# Patient Record
Sex: Female | Born: 1950 | Race: Black or African American | Hispanic: No | Marital: Married | Smoking: Never smoker
Health system: Southern US, Community
[De-identification: ages and names within clinical notes are randomized; demographics above are authoritative.]

## PROBLEM LIST (undated history)

## (undated) DIAGNOSIS — K219 Gastro-esophageal reflux disease without esophagitis: Secondary | ICD-10-CM

## (undated) DIAGNOSIS — T7840XA Allergy, unspecified, initial encounter: Secondary | ICD-10-CM

## (undated) DIAGNOSIS — I10 Essential (primary) hypertension: Secondary | ICD-10-CM

## (undated) DIAGNOSIS — R42 Dizziness and giddiness: Secondary | ICD-10-CM

## (undated) HISTORY — DX: Essential (primary) hypertension: I10

## (undated) HISTORY — DX: Gastro-esophageal reflux disease without esophagitis: K21.9

## (undated) HISTORY — PX: ABDOMINAL HYSTERECTOMY: SHX81

## (undated) HISTORY — DX: Allergy, unspecified, initial encounter: T78.40XA

## (undated) HISTORY — DX: Dizziness and giddiness: R42

---

## 1998-10-31 ENCOUNTER — Ambulatory Visit (HOSPITAL_COMMUNITY): Admission: RE | Admit: 1998-10-31 | Discharge: 1998-10-31 | Payer: Self-pay | Admitting: Neurology

## 2006-05-15 ENCOUNTER — Emergency Department: Payer: Self-pay

## 2008-05-24 ENCOUNTER — Emergency Department: Payer: Self-pay | Admitting: Emergency Medicine

## 2009-12-24 ENCOUNTER — Ambulatory Visit: Payer: Self-pay

## 2010-04-02 ENCOUNTER — Other Ambulatory Visit: Payer: Self-pay

## 2010-04-03 ENCOUNTER — Ambulatory Visit: Payer: Self-pay | Admitting: Unknown Physician Specialty

## 2010-11-14 ENCOUNTER — Other Ambulatory Visit: Payer: Self-pay

## 2011-05-19 ENCOUNTER — Ambulatory Visit: Payer: Self-pay

## 2013-02-07 ENCOUNTER — Emergency Department: Payer: Self-pay | Admitting: Unknown Physician Specialty

## 2013-02-07 LAB — COMPREHENSIVE METABOLIC PANEL
Albumin: 3.6 g/dL (ref 3.4–5.0)
Alkaline Phosphatase: 75 U/L (ref 50–136)
Bilirubin,Total: 1.2 mg/dL — ABNORMAL HIGH (ref 0.2–1.0)
Chloride: 107 mmol/L (ref 98–107)
Co2: 29 mmol/L (ref 21–32)
EGFR (African American): >60
Osmolality: 281 (ref 275–301)
Potassium: 3.8 mmol/L (ref 3.5–5.1)
SGOT(AST): 20 U/L (ref 15–37)
Total Protein: 7.3 g/dL (ref 6.4–8.2)

## 2013-02-07 LAB — CBC
HCT: 38.5 % (ref 35.0–47.0)
MCHC: 33 g/dL (ref 32.0–36.0)
MCV: 87 fL (ref 80–100)
RBC: 4.41 10*6/uL (ref 3.80–5.20)
RDW: 13.8 % (ref 11.5–14.5)

## 2013-02-07 LAB — LIPASE, BLOOD: Lipase: 90 U/L (ref 73–393)

## 2013-02-07 LAB — TROPONIN I: Troponin-I: <0.02 ng/mL

## 2013-05-14 ENCOUNTER — Emergency Department (HOSPITAL_COMMUNITY)
Admission: EM | Admit: 2013-05-14 | Discharge: 2013-05-14 | Disposition: A | Discharge status: Home / Self Care | Payer: BC Managed Care – PPO | Attending: Emergency Medicine | Admitting: Emergency Medicine

## 2013-05-14 ENCOUNTER — Encounter (HOSPITAL_COMMUNITY): Payer: Self-pay | Admitting: Emergency Medicine

## 2013-05-14 DIAGNOSIS — K8020 Calculus of gallbladder without cholecystitis without obstruction: Secondary | ICD-10-CM

## 2013-05-14 DIAGNOSIS — R42 Dizziness and giddiness: Secondary | ICD-10-CM | POA: Insufficient documentation

## 2013-05-14 DIAGNOSIS — Z880 Allergy status to penicillin: Secondary | ICD-10-CM | POA: Insufficient documentation

## 2013-05-14 DIAGNOSIS — R110 Nausea: Secondary | ICD-10-CM | POA: Insufficient documentation

## 2013-05-14 DIAGNOSIS — Z79899 Other long term (current) drug therapy: Secondary | ICD-10-CM | POA: Insufficient documentation

## 2013-05-14 DIAGNOSIS — K8050 Calculus of bile duct without cholangitis or cholecystitis without obstruction: Secondary | ICD-10-CM

## 2013-05-14 LAB — COMPREHENSIVE METABOLIC PANEL
ALT: 11 U/L (ref 0–35)
BUN: 9 mg/dL (ref 6–23)
CO2: 27 mEq/L (ref 19–32)
Calcium: 8.9 mg/dL (ref 8.4–10.5)
Chloride: 104 mEq/L (ref 96–112)
Creatinine, Ser: 0.58 mg/dL (ref 0.50–1.10)
GFR calc Af Amer: >90 mL/min (ref >90)
GFR calc non Af Amer: >90 mL/min (ref >90)
Glucose, Bld: 115 mg/dL — ABNORMAL HIGH (ref 70–99)
Total Bilirubin: 0.9 mg/dL (ref 0.3–1.2)
Total Protein: 7.4 g/dL (ref 6.0–8.3)

## 2013-05-14 LAB — CBC WITH DIFFERENTIAL/PLATELET
Basophils Relative: 0 % (ref 0–1)
Eosinophils Relative: 0 % (ref 0–5)
HCT: 40 % (ref 36.0–46.0)
Hemoglobin: 13.8 g/dL (ref 12.0–15.0)
Lymphocytes Relative: 16 % (ref 12–46)
MCH: 30.1 pg (ref 26.0–34.0)
MCHC: 34.5 g/dL (ref 30.0–36.0)
MCV: 87.1 fL (ref 78.0–100.0)
Monocytes Absolute: 0.2 10*3/uL (ref 0.1–1.0)
Monocytes Relative: 3 % (ref 3–12)
Neutro Abs: 5.9 10*3/uL (ref 1.7–7.7)
RDW: 13.7 % (ref 11.5–15.5)
WBC: 7.3 10*3/uL (ref 4.0–10.5)

## 2013-05-14 LAB — GLUCOSE, CAPILLARY: Glucose-Capillary: 93 mg/dL (ref 70–99)

## 2013-05-14 LAB — POCT I-STAT TROPONIN I: Troponin i, poc: 0 ng/mL (ref 0.00–0.08)

## 2013-05-14 MED ORDER — MECLIZINE HCL 25 MG PO TABS
12.5000 mg | ORAL_TABLET | Freq: Once | ORAL | Status: AC
  Administered 2013-05-14: 12.5 mg via ORAL
  Filled 2013-05-14: qty 1

## 2013-05-14 MED ORDER — MECLIZINE HCL 12.5 MG PO TABS: 12.5000 mg | ORAL_TABLET | Freq: Three times a day (TID) | ORAL | Status: DC | PRN

## 2013-05-14 NOTE — ED Notes (Signed)
Pt reports having dizziness since early oct. [facility] recently for [facility]iness and upper abd pain, was told she has gallstones and inner ear problems. No relief with pills given. The dizziness seems to be positional, increases when she lays down on certain side and when standing up. No acute distress noted at triage.

## 2013-05-14 NOTE — ED Notes (Signed)
Dr. Gentry at bedside. 

## 2013-05-16 NOTE — ED Provider Notes (Signed)
CSN: [id number]     Arrival date & time 05/14/13  1724 History   First MD Initiated Contact with Patient 05/14/13 1743     Chief Complaint  Patient presents with  . Dizziness   (Consider location/radiation/quality/duration/timing/severity/associated sxs/prior Treatment) Patient is a 62 y.o. female presenting with neurologic complaint.  Neurologic Problem This is a new problem. Episode onset: 2 weeks ago. The problem occurs constantly. The problem has been unchanged. Associated symptoms include nausea and vertigo. Pertinent negatives include no abdominal pain, chest pain, chills, congestion, coughing, fever, numbness, rash, sore throat or vomiting. Nothing aggravates the symptoms. She has tried nothing for the symptoms.    History reviewed. No pertinent past medical history. History reviewed. No pertinent past surgical history. History reviewed. No pertinent family history. History  Substance Use Topics  . Smoking status: Not on file  . Smokeless tobacco: Not on file  . Alcohol Use: No   OB History   Grav Para Term Preterm Abortions TAB SAB Ect Mult Living                 Review of Systems  Constitutional: Negative for fever and chills.  HENT: Negative for congestion, rhinorrhea and sore throat.   Eyes: Negative for photophobia and visual disturbance.  Respiratory: Negative for cough and shortness of breath.   Cardiovascular: Negative for chest pain and leg swelling.  Gastrointestinal: Positive for nausea. Negative for vomiting, abdominal pain, diarrhea and constipation.  Endocrine: Negative for polyphagia and polyuria.  Genitourinary: Negative for dysuria, flank pain, vaginal bleeding, vaginal discharge and enuresis.  Musculoskeletal: Negative for back pain and gait problem.  Skin: Negative for color change and rash.  Neurological: Positive for vertigo. Negative for dizziness, syncope, light-headedness and numbness.  Hematological: Negative for adenopathy. Does not  bruise/bleed easily.  All other systems reviewed and are negative.    Allergies  Penicillins  Home Medications   Current Outpatient Rx  Name  Route  Sig  Dispense  Refill  . meclizine (ANTIVERT) 12.5 MG tablet   Oral   Take 1 tablet (12.5 mg total) by mouth 3 (three) times daily as needed.   30 tablet   0    BP 135/83  Pulse 80  Temp(Src) 98.1 F (36.7 C) (Oral)  Resp 16  SpO2 97% Physical Exam  Vitals reviewed. Constitutional: She is oriented to person, place, and time. She appears well-developed and well-nourished.  HENT:  Head: Normocephalic and atraumatic.  Right Ear: External ear normal.  Left Ear: External ear normal.  Eyes: Conjunctivae and EOM are normal. Pupils are equal, round, and reactive to light.  Neck: Normal range of motion. Neck supple.  Cardiovascular: Normal rate, regular rhythm, normal heart sounds and intact distal pulses.   Pulmonary/Chest: Effort normal and breath sounds normal.  Abdominal: Soft. Bowel sounds are normal. There is no tenderness.  Musculoskeletal: Normal range of motion.  Neurological: She is alert and oriented to person, place, and time.  + dix halpike.  Symptoms relieved by epley  Skin: Skin is warm and dry.    ED Course  Procedures (including critical care time) Labs Review Labs Reviewed  CBC WITH DIFFERENTIAL - Abnormal; Notable for the following:    Neutrophils Relative % 81 (*)    All other components within normal limits  COMPREHENSIVE METABOLIC PANEL - Abnormal; Notable for the following:    Glucose, Bld 115 (*)    All other components within normal limits  GLUCOSE, CAPILLARY  POCT I-STAT TROPONIN I  Results for orders placed during the hospital encounter of 05/14/13  CBC WITH DIFFERENTIAL      Result Value Range   WBC 7.3  4.0 - 10.5 K/uL   RBC 4.59  3.87 - 5.11 MIL/uL   Hemoglobin 13.8  12.0 - 15.0 g/dL   HCT 82.9  56.2 - 13.0 %   MCV 87.1  78.0 - 100.0 fL   MCH 30.1  26.0 - 34.0 pg   MCHC 34.5  30.0 -  36.0 g/dL   RDW 86.5  78.4 - 69.6 %   Platelets 218  150 - 400 K/uL   Neutrophils Relative % 81 (*) 43 - 77 %   Neutro Abs 5.9  1.7 - 7.7 K/uL   Lymphocytes Relative 16  12 - 46 %   Lymphs Abs 1.2  0.7 - 4.0 K/uL   Monocytes Relative 3  3 - 12 %   Monocytes Absolute 0.2  0.1 - 1.0 K/uL   Eosinophils Relative 0  0 - 5 %   Eosinophils Absolute 0.0  0.0 - 0.7 K/uL   Basophils Relative 0  0 - 1 %   Basophils Absolute 0.0  0.0 - 0.1 K/uL  COMPREHENSIVE METABOLIC PANEL      Result Value Range   Sodium 141  135 - 145 mEq/L   Potassium 3.5  3.5 - 5.1 mEq/L   Chloride 104  96 - 112 mEq/L   CO2 27  19 - 32 mEq/L   Glucose, Bld 115 (*) 70 - 99 mg/dL   BUN 9  6 - 23 mg/dL   Creatinine, Ser 2.95  0.50 - 1.10 mg/dL   Calcium 8.9  8.4 - 28.4 mg/dL   Total Protein 7.4  6.0 - 8.3 g/dL   Albumin 3.8  3.5 - 5.2 g/dL   AST 16  0 - 37 U/L   ALT 11  0 - 35 U/L   Alkaline Phosphatase 71  39 - 117 U/L   Total Bilirubin 0.9  0.3 - 1.2 mg/dL   GFR calc non Af Amer >90  >90 mL/min   GFR calc Af Amer >90  >90 mL/min  GLUCOSE, CAPILLARY      Result Value Range   Glucose-Capillary 93  70 - 99 mg/dL   Comment 1 Documented in Chart    POCT I-STAT TROPONIN I      Result Value Range   Troponin i, poc 0.00  0.00 - 0.08 ng/mL   Comment 3             Imaging Review No results found.  EKG Interpretation   None       MDM   1. Vertigo   2. Cholelithiases   3. Biliary colic    62 y.o. female  without pertinent PMH presents with vertigo. Patient was seen at [facility] 2 weeks ago for same symptoms discharged home without medication. Patient also complained of abdominal pain in the right upper quadrant, and an ultrasound performed with cholelithiasis without cholecystitis. She is scheduled to follow up with gastroenterology. She denies recent increase in the symptoms and has not been evaluated for same per her own report. On arrival today patient has symptoms consistent with vertigo. Physical exam with  positive Dix-Hallpike towards the left. No carotid bruit.   Epley maneuver performed with relief of symptoms.  Discussed likely etiology of benign positional vertigo with patient, who voices understanding. Also discussed possibility of occult carotid or vertebral pathology, however and clinical scenario as above  doubt these are the etiology of patient's symptoms.  Patient discharged with meclizine and will follow up with PCP.    Labs and imaging as above reviewed by myself and attending,Dr. Lynelle Doctor, with whom case was discussed.   1. Vertigo   2. Cholelithiases   3. Biliary colic         Noel Gerold, MD 05/16/13 [phone]

## 2013-05-18 NOTE — ED Provider Notes (Signed)
I saw and evaluated the patient, reviewed the resident's note and I agree with the findings and plan. Pt presented  with complaints of vertigo and abdominal pain.  Evaluated 2 weeks ago at another hospital for the same complaint.    Has known gallstones with GI follow up  PE suggestive of peripheral vertigo.    Acute cholecystitis unlikely based on symptoms, labs and exam.  At this time there does not appear to be any evidence of an acute emergency medical condition and the patient appears stable for discharge with appropriate outpatient follow up.   EKG Rate 76 Sinus rhythm normal st t waves Nl intervals and axis No old tracing to compare  Celene Kras, MD 05/18/13 [phone]

## 2014-10-13 ENCOUNTER — Emergency Department: Payer: Self-pay | Admitting: Emergency Medicine

## 2015-05-09 ENCOUNTER — Emergency Department
Admission: EM | Admit: 2015-05-09 | Discharge: 2015-05-09 | Disposition: A | Discharge status: Home / Self Care | Payer: PPO | Attending: Emergency Medicine | Admitting: Emergency Medicine

## 2015-05-09 DIAGNOSIS — R42 Dizziness and giddiness: Secondary | ICD-10-CM | POA: Insufficient documentation

## 2015-05-09 DIAGNOSIS — R1110 Vomiting, unspecified: Secondary | ICD-10-CM | POA: Diagnosis not present

## 2015-05-09 DIAGNOSIS — Z880 Allergy status to penicillin: Secondary | ICD-10-CM | POA: Diagnosis not present

## 2015-05-09 MED ORDER — ONDANSETRON HCL 4 MG PO TABS: 4.0000 mg | ORAL_TABLET | Freq: Three times a day (TID) | ORAL | Status: DC | PRN

## 2015-05-09 MED ORDER — MECLIZINE HCL 50 MG PO TABS: 50.0000 mg | ORAL_TABLET | Freq: Three times a day (TID) | ORAL | Status: DC | PRN

## 2015-05-09 MED ORDER — MECLIZINE HCL 25 MG PO TABS
25.0000 mg | ORAL_TABLET | Freq: Once | ORAL | Status: AC
  Administered 2015-05-09: 25 mg via ORAL
  Filled 2015-05-09: qty 1

## 2015-05-09 MED ORDER — ONDANSETRON 4 MG PO TBDP
4.0000 mg | ORAL_TABLET | Freq: Once | ORAL | Status: AC
  Administered 2015-05-09: 4 mg via ORAL
  Filled 2015-05-09: qty 1

## 2015-05-09 NOTE — Discharge Instructions (Signed)
If you have any other symptoms aside from simple vertigo including weakness, difficulty walking, difficulty talking, headache, or lightheadedness please return to the emergency department. If your symptoms persist or worsen in anyway concerning to please return to the emergency room. Follow-up closely as an outpatient for further work on this issue.

## 2015-05-09 NOTE — ED Notes (Signed)
Patient ambulated around nurses station and denies dizziness.

## 2015-05-09 NOTE — ED Provider Notes (Addendum)
[facility] Emergency Department Provider Note  ____________________________________________   I have reviewed the triage vital signs and the nursing notes.   HISTORY  Chief Complaint Dizziness    HPI Erin Rowe is a 64 y.o. female presents today complaining of vertigo. In fact her chief complaint is "my vertigo is back". Patient has had this 5 or 6 times she states in the last 5 or 6 years. She has had positive success with Epley maneuver and tried something similar at home with no results this time. She has had some vomiting. She has had no headache. She denies any numbness or weakness or any other neurologic problem. She denies difficult speaking or talking. Began the day before yesterday in the evening. No recent URI or inciting symptoms. Feels exactly like multiple prior episodes of vertigo.  History reviewed. No pertinent past medical history.  There are no active problems to display for this patient.   History reviewed. No pertinent past surgical history.  Current Outpatient Rx  Name  Route  Sig  Dispense  Refill  . meclizine (ANTIVERT) 12.5 MG tablet   Oral   Take 1 tablet (12.5 mg total) by mouth 3 (three) times daily as needed.   30 tablet   0     Allergies Penicillins  History reviewed. No pertinent family history.  Social History Social History  Substance Use Topics  . Smoking status: Never Smoker   . Smokeless tobacco: None  . Alcohol Use: No    Review of Systems Constitutional: No fever/chills Eyes: No visual changes. ENT: No sore throat. No stiff neck no neck pain Cardiovascular: Denies chest pain. Respiratory: Denies shortness of breath. Gastrointestinal:   Positive vomiting.  No diarrhea.  No constipation. Genitourinary: Negative for dysuria. Musculoskeletal: Negative lower extremity swelling Skin: Negative for rash. Neurological: Negative for headaches, focal weakness or numbness. 10-point ROS otherwise  negative.  ____________________________________________   PHYSICAL EXAM:  VITAL SIGNS: ED Triage Vitals  Enc Vitals Group     BP 05/09/15 0941 141/82 mmHg     Pulse Rate 05/09/15 0941 92     Resp 05/09/15 0941 18     Temp 05/09/15 0941 98.2 F (36.8 C)     Temp Source 05/09/15 0941 Oral     SpO2 05/09/15 0941 97 %     Weight --      Height --      Head Cir --      Peak Flow --      Pain Score --      Pain Loc --      Pain Edu? --      Excl. in GC? --     Constitutional: Alert and oriented. Well appearing and in no acute distress. Eyes: Conjunctivae are normal. PERRL. EOMI. Head: Atraumatic. Nose: No congestion/rhinnorhea. Mouth/Throat: Mucous membranes are moist.  Oropharynx non-erythematous. TMs are normal bilaterally with no evidence of acute pathology noted Neck: No stridor.   Nontender with no meningismus, I do not auscultate carotid bruits Cardiovascular: Normal rate, regular rhythm. Grossly normal heart sounds.  Good peripheral circulation. Respiratory: Normal respiratory effort.  No retractions. Lungs CTAB. Gastrointestinal: Soft and nontender. No distention. No guarding no rebound Back:  There is no focal tenderness or step off there is no midline tenderness there are no lesions noted. there is no CVA tenderness Musculoskeletal: No lower extremity tenderness. No joint effusions, no DVT signs strong distal pulses no edema Neurologic:  Normal speech and language. No gross focal  neurologic deficits are appreciated. Positive nystagmus noted, there is however no evidence of neurologic deficit otherwise. Nerves II through XII are grossly intact 5 out of 5 strength bilateral upper and lower extremity, heel-to-shin and rapid alternating hand motions are normal finger to nose within normal limits, no evidence of cerebellar dysfunction Skin:  Skin is warm, dry and intact. No rash noted. Psychiatric: Mood and affect are normal. Speech and behavior are  normal.  ____________________________________________   LABS (all labs ordered are listed, but only abnormal results are displayed)  Labs Reviewed - No data to display ____________________________________________  EKG  EKG of pain by triage shows normal sinus rhythm interpreted by me rate 74 bpm normal axis nonspecific ST changes unremarkable ____________________________________________  RADIOLOGY   ____________________________________________   PROCEDURES  Procedure(s) performed: None  Critical Care performed: None  ____________________________________________   INITIAL IMPRESSION / ASSESSMENT AND PLAN / ED COURSE  Pertinent labs & imaging results that were available during my care of the patient were reviewed by me and considered in my medical decision making (see chart for details).  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 She has taken nothing for this at home. We'll try oral medication to see if we can get her out of this, I will also consider the Epley maneuver. This has worked for her in the past. Patient is requesting it. At this time, there is no evidence to suggest that this is a CVA given the recurrent nature of her symptoms. However, we will strongly advised close follow-up with neurology and ENT as an outpatient if we can get the symptoms to resolve here. I do not think emergent imaging is warranted at this time however, we will reassess ____________________________________________  ----------------------------------------- 10:44 AM on 05/09/2015 -----------------------------------------  Patient feels much better at this time after Antivert and Zofran, we did do an Epley repositioning maneuver, patient tolerated it very well with no Medications at this time she states she is almost back to baseline without try to walk around. Neurologic exam remains normal  ----------------------------------------- 11:00 AM on  05/09/2015 -----------------------------------------  Patient relates throughout the room feels great relief has no evidence of vertigo no symptoms or signs and no complaints and we'll discharge her extensive return precautions given and understood. Follow-up understood as well.  FINAL CLINICAL IMPRESSION(S) / ED DIAGNOSES  Vertigo   Jeanmarie PlantJames A Ruby Dilone, MD 05/09/15 1022  Jeanmarie PlantJames A Vian Fluegel, MD 05/09/15 1025  Jeanmarie PlantJames A Kymber Kosar, MD 05/09/15 1044  Jeanmarie PlantJames A Kaziyah Parkison, MD 05/09/15 1101

## 2015-05-09 NOTE — ED Notes (Signed)
Vertigo since Monday night.

## 2015-05-22 ENCOUNTER — Telehealth: Payer: Self-pay | Admitting: Family Medicine

## 2015-05-22 NOTE — Telephone Encounter (Signed)
R/s to 06/29/2015

## 2015-05-22 NOTE — Telephone Encounter (Signed)
30min appointment when possible.  Thanks. 

## 2015-05-22 NOTE — Telephone Encounter (Signed)
Mrs. Erin Rowe called and is wanting to establish as a patient w/you.  Her husband, Erin Rowe is your patient as well.  The first thing you have available is 10/16/2015.  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  Can you accommodate an apptmt for new patient w/[facility] [employer] in December?  Please advise. Thank you.

## 2015-05-31 ENCOUNTER — Ambulatory Visit
Admission: RE | Admit: 2015-05-31 | Discharge: 2015-05-31 | Disposition: A | Discharge status: Home / Self Care | Payer: PPO | Source: Ambulatory Visit | Attending: Unknown Physician Specialty | Admitting: Unknown Physician Specialty

## 2015-05-31 ENCOUNTER — Encounter
Admission: RE | Disposition: A | Discharge status: Home / Self Care | Payer: Self-pay | Source: Ambulatory Visit | Attending: Unknown Physician Specialty

## 2015-05-31 ENCOUNTER — Ambulatory Visit: Payer: PPO | Admitting: Anesthesiology

## 2015-05-31 ENCOUNTER — Encounter: Payer: Self-pay | Admitting: *Deleted

## 2015-05-31 DIAGNOSIS — K621 Rectal polyp: Secondary | ICD-10-CM | POA: Diagnosis not present

## 2015-05-31 DIAGNOSIS — Z1211 Encounter for screening for malignant neoplasm of colon: Secondary | ICD-10-CM | POA: Diagnosis present

## 2015-05-31 DIAGNOSIS — Z800 Family history of malignant neoplasm of digestive organs: Secondary | ICD-10-CM | POA: Insufficient documentation

## 2015-05-31 DIAGNOSIS — K640 First degree hemorrhoids: Secondary | ICD-10-CM | POA: Diagnosis not present

## 2015-05-31 HISTORY — PX: COLONOSCOPY WITH PROPOFOL: SHX5780

## 2015-05-31 LAB — HM COLONOSCOPY
HM COLON: NORMAL
HM Colonoscopy: NORMAL

## 2015-05-31 SURGERY — COLONOSCOPY WITH PROPOFOL
Anesthesia: General

## 2015-05-31 MED ORDER — PROPOFOL 10 MG/ML IV BOLUS
INTRAVENOUS | Status: DC | PRN
Start: 2015-05-31 — End: 2015-05-31
  Administered 2015-05-31: 50 mg via INTRAVENOUS

## 2015-05-31 MED ORDER — MIDAZOLAM HCL 5 MG/5ML IJ SOLN
INTRAMUSCULAR | Status: DC | PRN
  Administered 2015-05-31: 1 mg via INTRAVENOUS

## 2015-05-31 MED ORDER — SODIUM CHLORIDE 0.9 % IV SOLN: INTRAVENOUS | Status: DC

## 2015-05-31 MED ORDER — SODIUM CHLORIDE 0.9 % IV SOLN
INTRAVENOUS | Status: DC
  Administered 2015-05-31: 11:00:00 via INTRAVENOUS

## 2015-05-31 MED ORDER — FENTANYL CITRATE (PF) 100 MCG/2ML IJ SOLN
INTRAMUSCULAR | Status: DC | PRN
  Administered 2015-05-31: 50 ug via INTRAVENOUS

## 2015-05-31 MED ORDER — LIDOCAINE HCL (CARDIAC) 20 MG/ML IV SOLN
INTRAVENOUS | Status: DC | PRN
  Administered 2015-05-31: 50 mg via INTRAVENOUS

## 2015-05-31 MED ORDER — PROPOFOL 500 MG/50ML IV EMUL
INTRAVENOUS | Status: DC | PRN
  Administered 2015-05-31: 120 ug/kg/min via INTRAVENOUS

## 2015-05-31 MED ORDER — EPHEDRINE SULFATE 50 MG/ML IJ SOLN
INTRAMUSCULAR | Status: DC | PRN
  Administered 2015-05-31: 10 mg via INTRAVENOUS

## 2015-05-31 NOTE — Anesthesia Preprocedure Evaluation (Addendum)
Anesthesia Evaluation  Patient identified by MRN, date of birth, ID band Patient awake    Reviewed: Allergy & Precautions, NPO status , Patient's Chart, lab work & pertinent test results  History of Anesthesia Complications (+) PONV  Airway Mallampati: II  TM Distance: >3 FB     Dental no notable dental hx. (+) Upper Dentures   Pulmonary neg pulmonary ROS,    Pulmonary exam normal breath sounds clear to auscultation       Cardiovascular negative cardio ROS Normal cardiovascular exam     Neuro/Psych negative neurological ROS  negative psych ROS   GI/Hepatic negative GI ROS, Neg liver ROS,   Endo/Other  negative endocrine ROS  Renal/GU negative Renal ROS  negative genitourinary   Musculoskeletal negative musculoskeletal ROS (+)   Abdominal Normal abdominal exam  (+)   Peds negative pediatric ROS (+)  Hematology negative hematology ROS (+)   Anesthesia Other Findings   Reproductive/Obstetrics                            Anesthesia Physical Anesthesia Plan  ASA: II  Anesthesia Plan: General   Post-op Pain Management:    Induction: Intravenous  Airway Management Planned: Nasal Cannula  Additional Equipment:   Intra-op Plan:   Post-operative Plan:   Informed Consent: I have reviewed the patients History and Physical, chart, labs and discussed the procedure including the risks, benefits and alternatives for the proposed anesthesia with the patient or authorized representative who has indicated his/her understanding and acceptance.   Dental advisory given  Plan Discussed with: CRNA and Surgeon  Anesthesia Plan Comments:         Anesthesia Quick Evaluation

## 2015-05-31 NOTE — Op Note (Signed)
[facility] Gastroenterology Patient Name: Erin Rowe Procedure Date: 05/31/2015 11:16 AM MRN: [id number] Account #: [id number] Date of Birth: 12/06/1950 Admit Type: Outpatient Age: [age] [facility] Gender: Female Note Status: Finalized Procedure:         Colonoscopy Indications:       Screening in patient at increased risk: Family history of                     1st-degree relative with colorectal cancer Providers:         Scot Junobert T. Izeah Vossler, MD Referring MD:      Dwana CurdGraham S. Para Marchuncan (Referring MD) Medicines:         Propofol per Anesthesia Complications:     No immediate complications. Procedure:         Pre-Anesthesia Assessment:                    - After reviewing the risks and benefits, the patient was                     deemed in satisfactory condition to undergo the procedure.                    After obtaining informed consent, the colonoscope was                     passed under direct vision. Throughout the procedure, the                     patient's blood pressure, pulse, and oxygen saturations                     were monitored continuously. The Colonoscope was                     introduced through the anus and advanced to the the cecum,                     identified by appendiceal orifice and ileocecal valve. The                     colonoscopy was performed without difficulty. The patient                     tolerated the procedure well. The quality of the bowel                     preparation was excellent. Findings:      Three sessile polyps were found in the rectum. The polyps were       diminutive in size. These polyps were removed with a jumbo cold forceps.       Resection and retrieval were complete.      Internal hemorrhoids were found during endoscopy. The hemorrhoids were       small and Grade I (internal hemorrhoids that do not prolapse).      The exam was otherwise without abnormality. Impression:        - Three diminutive  polyps in the rectum. Resected and                     retrieved.                    - Internal hemorrhoids.                    -  The examination was otherwise normal. Recommendation:    - Await pathology results. Scot Jun, MD 05/31/2015 11:42:15 AM This report has been signed electronically. Number of Addenda: 0 Note Initiated On: 05/31/2015 11:16 AM Scope Withdrawal Time: 0 hours 12 minutes 51 seconds  Total Procedure Duration: 0 hours 16 minutes 41 seconds       [facility]

## 2015-05-31 NOTE — H&P (Signed)
   Primary Care Physician:  Crawford GivensGraham Duncan, MD Primary Gastroenterologist:  Dr. Mechele CollinElliott  Pre-Procedure History & Physical: HPI:  Theodosia Blenderancy L Longstreth is a 64 y.o. female is here for an colonoscopy.   History reviewed. No pertinent past medical history.  Past Surgical History  Procedure Laterality Date  . Abdominal hysterectomy      Prior to Admission medications   Medication Sig Start Date End Date Taking? Authorizing Provider  ondansetron (ZOFRAN) 4 MG tablet Take 1 tablet (4 mg total) by mouth every 8 (eight) hours as needed for nausea or vomiting. 05/09/15  Yes Jeanmarie PlantJames A McShane, MD  meclizine (ANTIVERT) 50 MG tablet Take 1 tablet (50 mg total) by mouth 3 (three) times daily as needed. 05/09/15   Jeanmarie PlantJames A McShane, MD    Allergies as of 04/10/2015 - Review Complete 05/14/2013  Allergen Reaction Noted  . Penicillins Rash 05/14/2013    History reviewed. No pertinent family history.  Social History   Social History  . Marital Status: Married    Spouse Name: N/A  . Number of Children: N/A  . Years of Education: N/A   Occupational History  . Not on file.   Social History Main Topics  . Smoking status: Never Smoker   . Smokeless tobacco: Never Used  . Alcohol Use: No  . Drug Use: No  . Sexual Activity: Not on file   Other Topics Concern  . Not on file   Social History Narrative    Review of Systems: See HPI, otherwise negative ROS  Physical Exam: BP 132/80 mmHg  Pulse 88  Temp(Src) 97.5 F (36.4 C) (Tympanic)  Resp 12  Ht 5\' 7"  (1.702 m)  Wt 97.07 kg (214 lb)  BMI 33.51 kg/m2  SpO2 100% General:   Alert,  pleasant and cooperative in NAD Head:  Normocephalic and atraumatic. Neck:  Supple; no masses or thyromegaly. Lungs:  Clear throughout to auscultation.    Heart:  Regular rate and rhythm. Abdomen:  Soft, nontender and nondistended. Normal bowel sounds, without guarding, and without rebound.   Neurologic:  Alert and  oriented x4;  grossly normal  neurologically.  Impression/Plan: Lelon HuhNancy L Rockwell is here for an colonoscopy to be performed for [facility] colon polyps and FH colon cancer in mother  Risks, benefits, limitations, and alternatives regarding  colonoscopy have been reviewed with the patient.  Questions have been answered.  All parties agreeable.   Lynnae PrudeELLIOTT, Vidalia Serpas, MD  05/31/2015, 11:13 AM

## 2015-05-31 NOTE — Transfer of Care (Signed)
Immediate Anesthesia Transfer of Care Note  Patient: Erin Rowe  Procedure(s) Performed: Procedure(s): COLONOSCOPY WITH PROPOFOL (N/A)  Patient Location: PACU  Anesthesia Type:General  Level of Consciousness: awake, alert , oriented and patient cooperative  Airway & Oxygen Therapy: Patient Spontanous Breathing and Patient connected to nasal cannula oxygen  Post-op Assessment: Report given to RN and Post -op Vital signs reviewed and stable  Post vital signs: Reviewed and stable  Last Vitals:  Filed Vitals:   05/31/15 1143  BP: 109/68  Pulse:   Temp: 36.4 C  Resp: 24    Complications: No apparent anesthesia complications

## 2015-06-01 LAB — SURGICAL PATHOLOGY

## 2015-06-01 NOTE — Anesthesia Postprocedure Evaluation (Signed)
  Anesthesia Post-op Note  Patient: Erin Rowe  Procedure(s) Performed: Procedure(s): COLONOSCOPY WITH PROPOFOL (N/A)  Anesthesia type:General  Patient location: PACU  Post pain: Pain level controlled  Post assessment: Post-op Vital signs reviewed, Patient's Cardiovascular Status Stable, Respiratory Function Stable, Patent Airway and No signs of Nausea or vomiting  Post vital signs: Reviewed and stable  Last Vitals:  Filed Vitals:   05/31/15 1212  BP: 115/78  Pulse: 93  Temp:   Resp: 19    Level of consciousness: awake, alert  and patient cooperative  Complications: No apparent anesthesia complications

## 2015-06-28 ENCOUNTER — Encounter: Payer: Self-pay | Admitting: Family Medicine

## 2015-06-29 ENCOUNTER — Ambulatory Visit (INDEPENDENT_AMBULATORY_CARE_PROVIDER_SITE_OTHER): Payer: PPO | Admitting: Family Medicine

## 2015-06-29 ENCOUNTER — Encounter: Payer: Self-pay | Admitting: Family Medicine

## 2015-06-29 ENCOUNTER — Encounter (INDEPENDENT_AMBULATORY_CARE_PROVIDER_SITE_OTHER): Payer: Self-pay

## 2015-06-29 VITALS — BP 140/92 | HR 94 | Temp 98.8°F | Ht 67.0 in | Wt 220.0 lb

## 2015-06-29 DIAGNOSIS — H8110 Benign paroxysmal vertigo, unspecified ear: Secondary | ICD-10-CM

## 2015-06-29 DIAGNOSIS — Z13220 Encounter for screening for lipoid disorders: Secondary | ICD-10-CM

## 2015-06-29 DIAGNOSIS — Z119 Encounter for screening for infectious and parasitic diseases, unspecified: Secondary | ICD-10-CM | POA: Diagnosis not present

## 2015-06-29 DIAGNOSIS — Z7189 Other specified counseling: Secondary | ICD-10-CM

## 2015-06-29 DIAGNOSIS — Z23 Encounter for immunization: Secondary | ICD-10-CM | POA: Diagnosis not present

## 2015-06-29 DIAGNOSIS — K219 Gastro-esophageal reflux disease without esophagitis: Secondary | ICD-10-CM

## 2015-06-29 DIAGNOSIS — Z0000 Encounter for general adult medical examination without abnormal findings: Secondary | ICD-10-CM

## 2015-06-29 NOTE — Patient Instructions (Addendum)
You can call for a mammogram at [facility] at [facility].  [address] [place] [phone]  Stay off greasy foods and use prilosec if needed.   Update me if you keep having trouble.  Okay to take meclizine and use the bedside exercises as needed for vertigo.  Come back for fasting labs when convenient.  Take care.  Glad to see you.

## 2015-06-29 NOTE — Progress Notes (Signed)
Pre visit review using our clinic review tool, if applicable. No additional management support is needed unless otherwise documented below in the visit note.  New patient to est care.  Requesting records.    Living will d/w pt.  Daughter Delice Bisonara designated if patient were incapacitated.   D/w pt about lipid HIV and HCV screening.  We can do with next set of labs.   Colonoscopy 2016, with 5 year f/u.  Mammogram due, d/w pt.  Pap not due.  Defer DXA for now, d/w pt.   Reasonable to defer for now.   We can update other vaccines as needed.  Flu shot done at OV.    Vertigo.  Episodic.  Comes and does.  Worse with head position change.  Not lightheaded. Will self resolve.  Has used meclizine and bedside exercises prev.   GERD likely.  With greasy foods, she'll feel a sensation of food "sticking" near the GE junction (lower sternal area) but can still have liquids pass when she is having sx.  No sensation of absolute dysphagia were solids/liquids wouldn't pass.  Better with change in diet.  No heartburn o/w.  Has rx for PPI but hasn't used yet.  Clearly better with diet changes.    PMH and SH reviewed  ROS: See HPI, otherwise noncontributory.  Meds, vitals, and allergies reviewed.   GEN: nad, alert and oriented HEENT: mucous membranes moist NECK: supple w/o LA, tm wnl, nasal and Op exam wnl CV: rrr.   PULM: ctab, no inc wob ABD: soft, +bs EXT: no edema SKIN: no acute rash

## 2015-07-01 ENCOUNTER — Encounter: Payer: Self-pay | Admitting: Family Medicine

## 2015-07-01 DIAGNOSIS — Z0000 Encounter for general adult medical examination without abnormal findings: Secondary | ICD-10-CM | POA: Insufficient documentation

## 2015-07-01 DIAGNOSIS — Z7189 Other specified counseling: Secondary | ICD-10-CM | POA: Insufficient documentation

## 2015-07-01 DIAGNOSIS — K219 Gastro-esophageal reflux disease without esophagitis: Secondary | ICD-10-CM | POA: Insufficient documentation

## 2015-07-01 DIAGNOSIS — H8110 Benign paroxysmal vertigo, unspecified ear: Secondary | ICD-10-CM | POA: Insufficient documentation

## 2015-07-01 NOTE — Assessment & Plan Note (Signed)
Likely, has rx for PPI to use and this is reasonable should sx get worse. Clearly better with diet changes.  Anatomy d/w pt.  She agrees. >30 minutes spent in face to face time with patient, >50% spent in counselling or coordination of care.

## 2015-07-01 NOTE — Assessment & Plan Note (Signed)
D/w pt about lipid HIV and HCV screening.  We can do with next set of labs.   Colonoscopy 2016, with 5 year f/u.  Mammogram due, d/w pt.  Pap not due.  Defer DXA for now, d/w pt.   Reasonable to defer for now.   We can update other vaccines as needed.  Flu shot done at OV.

## 2015-07-01 NOTE — Assessment & Plan Note (Signed)
Likely, path/phys d/w pt.  No sx now.  Can use bedside exercise and meclizine prn.  She agrees.  Okay for outpatient f/u.

## 2015-07-02 ENCOUNTER — Encounter: Payer: Self-pay | Admitting: Family Medicine

## 2015-08-06 ENCOUNTER — Other Ambulatory Visit: Payer: PPO

## 2015-08-22 ENCOUNTER — Other Ambulatory Visit: Payer: Self-pay | Admitting: Family Medicine

## 2015-08-22 ENCOUNTER — Other Ambulatory Visit (INDEPENDENT_AMBULATORY_CARE_PROVIDER_SITE_OTHER): Payer: PPO

## 2015-08-22 DIAGNOSIS — Z119 Encounter for screening for infectious and parasitic diseases, unspecified: Secondary | ICD-10-CM | POA: Diagnosis not present

## 2015-08-22 DIAGNOSIS — Z13220 Encounter for screening for lipoid disorders: Secondary | ICD-10-CM

## 2015-08-22 LAB — LIPID PANEL
CHOL/HDL RATIO: 4
Cholesterol: 203 mg/dL — ABNORMAL HIGH (ref 0–200)
HDL: 53.2 mg/dL (ref >39.00)
LDL CALC: 121 mg/dL — AB (ref 0–99)
NonHDL: 150.05
TRIGLYCERIDES: 145 mg/dL (ref 0.0–149.0)
VLDL: 29 mg/dL (ref 0.0–40.0)

## 2015-08-23 LAB — HIV ANTIBODY (ROUTINE TESTING W REFLEX): HIV 1&2 Ab, 4th Generation: NONREACTIVE

## 2015-08-23 LAB — HEPATITIS C ANTIBODY: HCV AB: NEGATIVE

## 2015-08-27 ENCOUNTER — Encounter: Payer: Self-pay | Admitting: Family Medicine

## 2015-08-27 ENCOUNTER — Ambulatory Visit (INDEPENDENT_AMBULATORY_CARE_PROVIDER_SITE_OTHER): Payer: PPO | Admitting: Family Medicine

## 2015-08-27 VITALS — BP 122/82 | HR 92 | Temp 98.4°F | Wt 217.0 lb

## 2015-08-27 DIAGNOSIS — R739 Hyperglycemia, unspecified: Secondary | ICD-10-CM | POA: Diagnosis not present

## 2015-08-27 DIAGNOSIS — Z23 Encounter for immunization: Secondary | ICD-10-CM

## 2015-08-27 NOTE — Progress Notes (Signed)
Pre visit review using our clinic review tool, if applicable. No additional management support is needed unless otherwise documented below in the visit note.  F/u for outside labs.  Lipids improved, TG lower. A1c up slightly, but not in DM2 range.  D/w pt.  See scanned forms.   HIV and HCV neg, d/w pt .  Meds, vitals, and allergies reviewed.   ROS: See HPI.  Otherwise, noncontributory.  nad ncat Mmm rrr ctab

## 2015-08-27 NOTE — Patient Instructions (Signed)
Check with your insurance to see if they will cover the shingles and tetanus shots.  They may be cheaper at the pharmacy.   Call about the mammogram when you get a chance.   Use the eat right diet to help with your sugar.   We can recheck your sugar and cholesterol yearly.  Take care.  Glad to see you.

## 2015-08-28 ENCOUNTER — Encounter: Payer: Self-pay | Admitting: Family Medicine

## 2015-08-28 DIAGNOSIS — R739 Hyperglycemia, unspecified: Secondary | ICD-10-CM | POA: Insufficient documentation

## 2015-08-28 LAB — HEMOGLOBIN A1C: A1C: 6.1

## 2015-08-28 NOTE — Assessment & Plan Note (Signed)
A1c 6.1.  She'll work on diet and exercise, d/w pt about low carb/eat right diet.  She agrees.  We can recheck periodically.   >15 minutes spent in face to face time with patient, >50% spent in counselling or coordination of care.

## 2015-10-16 ENCOUNTER — Ambulatory Visit: Payer: PPO | Admitting: Family Medicine

## 2016-08-01 ENCOUNTER — Ambulatory Visit: Payer: PPO

## 2016-08-08 ENCOUNTER — Encounter: Payer: PPO | Admitting: Family Medicine

## 2016-08-11 ENCOUNTER — Encounter: Payer: Self-pay | Admitting: Primary Care

## 2016-08-11 ENCOUNTER — Ambulatory Visit (INDEPENDENT_AMBULATORY_CARE_PROVIDER_SITE_OTHER): Payer: PPO | Admitting: Primary Care

## 2016-08-11 VITALS — BP 120/78 | HR 89 | Temp 98.9°F | Ht 67.0 in | Wt 206.0 lb

## 2016-08-11 DIAGNOSIS — R6889 Other general symptoms and signs: Secondary | ICD-10-CM

## 2016-08-11 LAB — POC INFLUENZA A&B (BINAX/QUICKVUE)
Influenza A, POC: NEGATIVE
Influenza B, POC: NEGATIVE

## 2016-08-11 NOTE — Patient Instructions (Signed)
Your symptoms are representative of a viral illness which will resolve on its own over time. Our goal is to treat your symptoms in order to aid your body in the healing process and to make you more comfortable.   Continue tylenol for fevers and body aches. Do not take ibuprofen as this may cause upset to your stomach.  Please notify me if you develop persistent fevers of 101, start coughing up green mucous, notice increased fatigue or weakness, or feel worse after 1 week of onset of symptoms, you develop vomiting and diarrhea.   Increase consumption of water intake and rest.  It was a pleasure meeting you!

## 2016-08-11 NOTE — Progress Notes (Signed)
Subjective:    Patient ID: Erin Rowe, female    DOB: 1950-12-31, 66 y.o.   MRN: [id number]  HPI  Ms. Loden is a 66 year old female with a history of GERD who presents today with a chief complaint of abdominal pain. She also reports fevers, chills, mid back pain, fatigue. Her symptoms began Saturday this past weekend (2 days ago). Her abdominal pain is located to the epigastric region and is intermittent. She denies nausea, vomiting, diarrhea, cough, congestion. She's been taking tylenol for her fevers with her last dose being at 6 am. Her fevers are running up to 102.  Review of Systems  Constitutional: Positive for appetite change, chills, fatigue and fever.  HENT: Negative for congestion, sinus pressure and sore throat.   Respiratory: Negative for cough and shortness of breath.   Cardiovascular: Negative for chest pain.       Past Medical History:  Diagnosis Date  . Allergy   . GERD (gastroesophageal reflux disease)   . Vertigo      Social History   Social History  . Marital status: Married    Spouse name: N/A  . Number of children: N/A  . Years of education: N/A   Occupational History  . Not on file.   Social History Main Topics  . Smoking status: Never Smoker  . Smokeless tobacco: Never Used  . Alcohol use No  . Drug use: No  . Sexual activity: Not on file   Other Topics Concern  . Not on file   Social History Narrative   Married 1972    2 kids (1 in [place], 1 out of state)       Past Surgical History:  Procedure Laterality Date  . ABDOMINAL HYSTERECTOMY    . COLONOSCOPY WITH PROPOFOL N/A 05/31/2015   Procedure: COLONOSCOPY WITH PROPOFOL;  Surgeon: Scot Jun, MD;  Location: [facility] ENDOSCOPY;  Service: Endoscopy;  Laterality: N/A;    Family History  Problem Relation Age of Onset  . Heart disease Mother   . Hypertension Mother   . Kidney disease Mother   . Stroke Mother   . Colon cancer Mother   . Prostate cancer Father   . Dementia Father     . Breast cancer Neg Hx     Allergies  Allergen Reactions  . Penicillins Rash    Current Outpatient Prescriptions on File Prior to Visit  Medication Sig Dispense Refill  . aspirin 81 MG tablet Take 81 mg by mouth daily as needed for pain.    Marland Kitchen omeprazole (PRILOSEC) 20 MG capsule Take 20 mg by mouth daily.    . meclizine (ANTIVERT) 50 MG tablet Take 1 tablet (50 mg total) by mouth 3 (three) times daily as needed. (Patient not taking: Reported on 08/11/2016) 30 tablet 0   No current facility-administered medications on file prior to visit.     BP 120/78   Pulse 89   Temp 98.9 F (37.2 C) (Oral)   Ht 5\' 7"  (1.702 m)   Wt 206 lb (93.4 kg)   SpO2 98%   BMI 32.26 kg/m    Objective:   Physical Exam  Constitutional: She appears well-nourished. She does not appear ill.  HENT:  Right Ear: Tympanic membrane and ear canal normal.  Left Ear: Tympanic membrane and ear canal normal.  Nose: Right sinus exhibits no maxillary sinus tenderness and no frontal sinus tenderness. Left sinus exhibits no maxillary sinus tenderness and no frontal sinus tenderness.  Mouth/Throat: Oropharynx  is clear and moist.  Eyes: Conjunctivae are normal.  Neck: Neck supple.  Cardiovascular: Normal rate and regular rhythm.   Pulmonary/Chest: Effort normal and breath sounds normal. She has no wheezes. She has no rales.  Abdominal: Soft. Normal appearance and bowel sounds are normal. There is no tenderness.  Lymphadenopathy:    She has no cervical adenopathy.  Skin: Skin is warm and dry.          Assessment & Plan:  Flu-Like Symptoms:  Fatigue, fevers (up to 102), chills, epigastric pain x 2 days. Improvement with tylenol.  Exam today without abdominal tenderness, does not appear acutely ill, respiratory exam unremarkable.  Rapid Flu: negative Low suspicion for cardiac involvement. Suspect viral involvement given presentation and lack of other symptoms. Continue tylenol for fevers and body aches,  avoid ibuprofen given epigastric pain. Return precautions provided.  Morrie Sheldonlark,Katherine Kendal, NP

## 2016-08-11 NOTE — Progress Notes (Signed)
Pre visit review using our clinic review tool, if applicable. No additional management support is needed unless otherwise documented below in the visit note. 

## 2016-11-09 ENCOUNTER — Other Ambulatory Visit: Payer: Self-pay | Admitting: Family Medicine

## 2016-11-09 DIAGNOSIS — R739 Hyperglycemia, unspecified: Secondary | ICD-10-CM

## 2016-11-14 ENCOUNTER — Other Ambulatory Visit: Payer: PPO

## 2016-11-14 ENCOUNTER — Other Ambulatory Visit (INDEPENDENT_AMBULATORY_CARE_PROVIDER_SITE_OTHER): Payer: PPO

## 2016-11-14 DIAGNOSIS — R739 Hyperglycemia, unspecified: Secondary | ICD-10-CM | POA: Diagnosis not present

## 2016-11-14 LAB — COMPREHENSIVE METABOLIC PANEL
ALBUMIN: 3.8 g/dL (ref 3.5–5.2)
ALK PHOS: 71 U/L (ref 39–117)
ALT: 13 U/L (ref 0–35)
AST: 15 U/L (ref 0–37)
BILIRUBIN TOTAL: 1 mg/dL (ref 0.2–1.2)
BUN: 9 mg/dL (ref 6–23)
CO2: 30 mEq/L (ref 19–32)
Calcium: 8.8 mg/dL (ref 8.4–10.5)
Chloride: 104 mEq/L (ref 96–112)
Creatinine, Ser: 0.64 mg/dL (ref 0.40–1.20)
GFR: 119.11 mL/min (ref >60.00)
GLUCOSE: 90 mg/dL (ref 70–99)
Potassium: 4 mEq/L (ref 3.5–5.1)
SODIUM: 139 meq/L (ref 135–145)
TOTAL PROTEIN: 6.9 g/dL (ref 6.0–8.3)

## 2016-11-14 LAB — LIPID PANEL
CHOL/HDL RATIO: 4
Cholesterol: 211 mg/dL — ABNORMAL HIGH (ref 0–200)
HDL: 56.7 mg/dL (ref >39.00)
LDL Cholesterol: 130 mg/dL — ABNORMAL HIGH (ref 0–99)
NONHDL: 154.49
TRIGLYCERIDES: 120 mg/dL (ref 0.0–149.0)
VLDL: 24 mg/dL (ref 0.0–40.0)

## 2016-11-14 LAB — HEMOGLOBIN A1C: Hgb A1c MFr Bld: 6.1 % (ref 4.6–6.5)

## 2016-11-18 ENCOUNTER — Encounter: Payer: Self-pay | Admitting: Family Medicine

## 2016-11-18 ENCOUNTER — Ambulatory Visit (INDEPENDENT_AMBULATORY_CARE_PROVIDER_SITE_OTHER): Payer: PPO | Admitting: Family Medicine

## 2016-11-18 VITALS — BP 122/84 | HR 86 | Temp 98.5°F | Ht 67.0 in | Wt 206.8 lb

## 2016-11-18 DIAGNOSIS — Z23 Encounter for immunization: Secondary | ICD-10-CM | POA: Diagnosis not present

## 2016-11-18 DIAGNOSIS — Z0000 Encounter for general adult medical examination without abnormal findings: Secondary | ICD-10-CM

## 2016-11-18 DIAGNOSIS — Z7189 Other specified counseling: Secondary | ICD-10-CM

## 2016-11-18 DIAGNOSIS — E2839 Other primary ovarian failure: Secondary | ICD-10-CM

## 2016-11-18 DIAGNOSIS — Z1231 Encounter for screening mammogram for malignant neoplasm of breast: Secondary | ICD-10-CM

## 2016-11-18 NOTE — Patient Instructions (Addendum)
Check with your insurance to see if they will cover the shingles and tetanus shots.  Erin Rowe will call about your referrals for mammogram and DXA/bone density test.   Take care.  Glad to see you.  Update me as needed.

## 2016-11-18 NOTE — Progress Notes (Signed)
Pre visit review using our clinic review tool, if applicable. No additional management support is needed unless otherwise documented below in the visit note. 

## 2016-11-18 NOTE — Progress Notes (Signed)
I have personally reviewed the Medicare Annual Wellness questionnaire and have noted 1. The patient's medical and social history 2. Their use of alcohol, tobacco or illicit drugs 3. Their current medications and supplements 4. The patient's functional ability including ADL's, fall risks, home safety risks and hearing or visual             impairment. 5. Diet and physical activities 6. Evidence for depression or mood disorders  The patients weight, height, BMI have been recorded in the chart and visual acuity is per eye clinic.  I have made referrals, counseling and provided education to the patient based review of the above and I have provided the pt with a written personalized care plan for preventive services.  Provider list updated- see scanned forms.  Routine anticipatory guidance given to patient.  See health maintenance. The possibility exists that previously documented standard health maintenance information may have been brought forward from a previous encounter into this note.  If needed, that same information has been updated to reflect the current situation based on today's encounter.    Flu previously done Shingles discussed with patient. See after visit summary. PNA 2018 Tetanus d/w pt.  See AVS.  Colonoscopy 2016 Breast cancer screening pending. Bone density testing pending Advance directive discussed with patient. Daughter Delice Bison designated if patient were incapacitated Cognitive function addressed- see scanned forms- and if abnormal then additional documentation follows.   No GERD sx.  No ADE on med, used as needed.  Mild HLD.  D/w pt.  Mild A1c elevation but not DM, d/w pt.  D/w pt about diet and exercise.  Both affected by work schedule prior to tax day on 11/09/16.    PMH and SH reviewed  Meds, vitals, and allergies reviewed.   ROS: Per HPI.  Unless specifically indicated otherwise in HPI, the patient denies:  General: fever. Eyes: acute vision changes ENT: sore  throat Cardiovascular: chest pain Respiratory: SOB GI: vomiting GU: dysuria Musculoskeletal: acute back pain Derm: acute rash Neuro: acute motor dysfunction Psych: worsening mood Endocrine: polydipsia Heme: bleeding Allergy: hayfever  GEN: nad, alert and oriented HEENT: mucous membranes moist NECK: supple w/o LA CV: rrr. PULM: ctab, no inc wob ABD: soft, +bs EXT: no edema SKIN: no acute rash

## 2016-11-19 NOTE — Assessment & Plan Note (Signed)
Flu previously done Shingles discussed with patient. See after visit summary. PNA 2018 Tetanus d/w pt.  See AVS.  Colonoscopy 2016 Breast cancer screening pending. Bone density testing pending Advance directive discussed with patient. Daughter Delice Bison designated if patient were incapacitated Cognitive function addressed- see scanned forms- and if abnormal then additional documentation follows.   No GERD sx.  No ADE on med, used as needed.  Mild HLD.  D/w pt.  Mild A1c elevation but not DM, d/w pt.  D/w pt about diet and exercise.  Both affected by work schedule prior to tax day on 11/09/16.  We can recheck yearly. She agrees.

## 2016-11-19 NOTE — Assessment & Plan Note (Signed)
Living will d/w pt. Daughter Delice Bison designated if patient were incapacitated.

## 2016-12-05 ENCOUNTER — Emergency Department
Admission: EM | Admit: 2016-12-05 | Discharge: 2016-12-05 | Disposition: A | Discharge status: Home / Self Care | Payer: PPO | Attending: Emergency Medicine | Admitting: Emergency Medicine

## 2016-12-05 ENCOUNTER — Encounter: Payer: Self-pay | Admitting: Emergency Medicine

## 2016-12-05 DIAGNOSIS — Z23 Encounter for immunization: Secondary | ICD-10-CM | POA: Insufficient documentation

## 2016-12-05 DIAGNOSIS — S60464A Insect bite (nonvenomous) of right ring finger, initial encounter: Secondary | ICD-10-CM | POA: Diagnosis not present

## 2016-12-05 DIAGNOSIS — W57XXXA Bitten or stung by nonvenomous insect and other nonvenomous arthropods, initial encounter: Secondary | ICD-10-CM | POA: Diagnosis not present

## 2016-12-05 DIAGNOSIS — Y999 Unspecified external cause status: Secondary | ICD-10-CM | POA: Diagnosis not present

## 2016-12-05 DIAGNOSIS — Z7982 Long term (current) use of aspirin: Secondary | ICD-10-CM | POA: Insufficient documentation

## 2016-12-05 DIAGNOSIS — S0101XA Laceration without foreign body of scalp, initial encounter: Secondary | ICD-10-CM | POA: Insufficient documentation

## 2016-12-05 DIAGNOSIS — W1801XA Striking against sports equipment with subsequent fall, initial encounter: Secondary | ICD-10-CM | POA: Diagnosis not present

## 2016-12-05 DIAGNOSIS — Y939 Activity, unspecified: Secondary | ICD-10-CM | POA: Insufficient documentation

## 2016-12-05 DIAGNOSIS — Y929 Unspecified place or not applicable: Secondary | ICD-10-CM | POA: Diagnosis not present

## 2016-12-05 DIAGNOSIS — S0990XA Unspecified injury of head, initial encounter: Secondary | ICD-10-CM | POA: Diagnosis not present

## 2016-12-05 MED ORDER — TETANUS-DIPHTH-ACELL PERTUSSIS 5-2.5-18.5 LF-MCG/0.5 IM SUSP
0.5000 mL | Freq: Once | INTRAMUSCULAR | Status: AC
  Administered 2016-12-05: 0.5 mL via INTRAMUSCULAR
  Filled 2016-12-05: qty 0.5

## 2016-12-05 MED ORDER — LIDOCAINE-EPINEPHRINE-TETRACAINE (LET) SOLUTION
3.0000 mL | Freq: Once | NASAL | Status: AC
  Administered 2016-12-05: 3 mL via TOPICAL
  Filled 2016-12-05: qty 3

## 2016-12-05 NOTE — ED Provider Notes (Signed)
[facility] Emergency Department Provider Note   ____________________________________________   I have reviewed the triage vital signs and the nursing notes.   HISTORY  Chief Complaint Insect Bite and Head Laceration    HPI Erin Rowe is a 66 y.o. female presents with right ring finger swelling secondary to being stung by a bee. Patient reports following the bee sting she fell backwards striking the back of her head on the nightstand sustaining a laceration to the posterior scalp. Patient denies loss of consciousness back or neck pain and recalls the events following the injury. Patient could not recall her last tetanus.Patient denies headache, vision changes, chest pain, chest tightness, shortness of breath, abdominal pain, nausea and vomiting.  Past Medical History:  Diagnosis Date  . Allergy   . GERD (gastroesophageal reflux disease)   . Vertigo     Patient Active Problem List   Diagnosis Date Noted  . Hyperglycemia 08/28/2015  . Advance care planning 07/01/2015  . Medicare annual wellness visit, initial 07/01/2015  . GERD (gastroesophageal reflux disease) 07/01/2015  . Benign paroxysmal positional vertigo 07/01/2015    Past Surgical History:  Procedure Laterality Date  . ABDOMINAL HYSTERECTOMY    . COLONOSCOPY WITH PROPOFOL N/A 05/31/2015   Procedure: COLONOSCOPY WITH PROPOFOL;  Surgeon: Scot Jun, MD;  Location: [facility] ENDOSCOPY;  Service: Endoscopy;  Laterality: N/A;    Prior to Admission medications   Medication Sig Start Date End Date Taking? Authorizing Provider  aspirin 81 MG tablet Take 81 mg by mouth daily as needed for pain.    [provider]  meclizine (ANTIVERT) 50 MG tablet Take 1 tablet (50 mg total) by mouth 3 (three) times daily as needed. 05/09/15   Jeanmarie Plant, MD  omeprazole (PRILOSEC) 20 MG capsule Take 20 mg by mouth daily.    [provider]    Allergies Penicillins  Family History    Problem Relation Age of Onset  . Heart disease Mother   . Hypertension Mother   . Kidney disease Mother   . Stroke Mother   . Colon cancer Mother   . Prostate cancer Father   . Dementia Father   . Breast cancer Neg Hx     Social History Social History  Substance Use Topics  . Smoking status: Never Smoker  . Smokeless tobacco: Never Used  . Alcohol use No    Review of Systems Constitutional:  Negative for fever/chills Eyes: No visual changes. Cardiovascular: Denies chest pain. Respiratory: Denies cough Denies shortness of breath. Gastrointestinal: No abdominal pain.  No nausea, vomiting, diarrhea. Musculoskeletal: Negative for back pain.   Skin: Negative for rash. Swelling and redness noticed along right ring finger. Posterior scalp laceration. Neurological: Negative for headaches.  Negative focal weakness or numbness. Negatie for loss of consciousness. ____________________________________________   PHYSICAL EXAM:  VITAL SIGNS: ED Triage Vitals  Enc Vitals Group     BP 12/05/16 2034 125/63     Pulse Rate 12/05/16 2034 96     Resp 12/05/16 2034 18     Temp 12/05/16 2034 98.5 F (36.9 C)     Temp Source 12/05/16 2034 Oral     SpO2 12/05/16 2034 100 %     Weight 12/05/16 2034 206 lb (93.4 kg)     Height 12/05/16 2034 5\' 7"  (1.702 m)     Head Circumference --      Peak Flow --      Pain Score 12/05/16 2033 0  Pain Loc --      Pain Edu? --      Excl. in GC? --     Constitutional: Alert and oriented. Well appearing and in no acute distress.  Head: Normocephalic and atraumatic. Eyes: Conjunctivae are normal. PERRL. Normal extraocular movements. Cardiovascular: Normal rate, regular rhythm. Normal distal pulses. Respiratory: Normal respiratory effort. No wheezes/rales/rhonchi. Lungs CTAB with no W/R/R. Gastrointestinal: Soft and nontender. No distention. Musculoskeletal: Nontender with normal range of motion in all extremities. Neurologic: Normal speech and  language. No gross focal neurologic deficits are appreciated. Skin:  Skin is warm, dry and intact except posterior scalp laceration, 7.0 cm, bleeding controlled. No rash noted. Psychiatric: Mood and affect are normal. Patient exhibits appropriate insight and judgment. ____________________________________________   LABS (all labs ordered are listed, but only abnormal results are displayed)  Labs Reviewed - No data to display ____________________________________________  EKG None  ____________________________________________  RADIOLOGY None ____________________________________________   PROCEDURES  Procedure(s) performed: LACERATION REPAIR Performed by: Clois Comberraci M Lenore Moyano Authorized by: Clois Comberraci M Macyn Shropshire Consent: Verbal consent obtained. Risks and benefits: risks, benefits and alternatives were discussed Consent given by: patient Patient identity confirmed: provided demographic data Prepped and Draped in normal sterile fashion Wound explored  Laceration Location: Posterior scalp  Laceration Length: 7.0 cm  No Foreign Bodies seen or palpated  Anesthesia: local infiltration  Local anesthetic: LET sol'n  Anesthetic total: 2.0 ml  Irrigation method: normal salinesyringe Amount of cleaning: standard  Skin closure: Staples  Number of staples: 12  Technique: Staples  Patient tolerance: Patient tolerated the procedure well with no immediate complications.   Critical Care performed: no ____________________________________________   INITIAL IMPRESSION / ASSESSMENT AND PLAN / ED COURSE  Pertinent labs & imaging results that were available during my care of the patient were reviewed by me and considered in my medical decision making (see chart for details).  Patient presented with posterior scalp laceration proximally 7 cm requiring staple closure. Patient tolerated laceration repair without complications. Physical exam findings are reassuring no other injuries were  noted patient was at her neurological baseline. Patient cannot recall last tetanus and was given a tetanus booster. Patient's right ring finger presented with localized swelling otherwise sensation and color were normal and intact. Patient did not have any anaphylactic symptoms during the course of her emergency department care. Patient will return to her primary care or the emergency department to have the staples removed approximately 10 days. Patient was instructed to monitor the wound for signs of infection and return if signs of infection presented.   Patient informed of clinical course, understand medical decision-making process, and agree with plan.        ____________________________________________   FINAL CLINICAL IMPRESSION(S) / ED DIAGNOSES  Final diagnoses:  Scalp laceration, initial encounter  Insect bite, initial encounter       NEW MEDICATIONS STARTED DURING THIS VISIT:  Discharge Medication List as of 12/05/2016 10:28 PM       Note:  This document was prepared using Dragon voice recognition software and may include unintentional dictation errors.   Percell BostonLittle, Joya Willmott M, PA-C 12/05/16 2301    Sharman CheekStafford, Phillip, MD 12/06/16 [id number]

## 2016-12-05 NOTE — ED Notes (Signed)
Patient reports she was stung by a bee in 4th finger, right hand at approx 1530 today. Pt reports she then fell back and hit head on nightstand. Pt has approx 2" laceration to dorsal head. Pt denies LOC, dizziness and lightheadedness. 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 She reports stiff mouth and finger after the incident. She took 1 benadryl, and vomited 20 minutes after taking the benadryl. Pt denies SOB, mouth/throat tightness. Pt reports continued stiffness and swelling in affected finger.

## 2016-12-05 NOTE — Discharge Instructions (Signed)
Staple removal in 10 days with PCP or return to the emergency department. If wound becomes warm, red or signs of infection follow up with PCP or return to the emergency department.

## 2016-12-05 NOTE — ED Triage Notes (Signed)
Pt ambulatory to triage in NAD, report bee sting to right 4th finger, swelling noted, pt states while trying to kill bee she hit head, 3" laceration noted to scalp, bleeding controlled at this time, denies LOC.

## 2016-12-15 ENCOUNTER — Encounter: Payer: Self-pay | Admitting: Emergency Medicine

## 2016-12-15 ENCOUNTER — Emergency Department
Admission: EM | Admit: 2016-12-15 | Discharge: 2016-12-15 | Disposition: A | Discharge status: Home / Self Care | Payer: PPO | Attending: Emergency Medicine | Admitting: Emergency Medicine

## 2016-12-15 DIAGNOSIS — Z7982 Long term (current) use of aspirin: Secondary | ICD-10-CM | POA: Diagnosis not present

## 2016-12-15 DIAGNOSIS — Z4802 Encounter for removal of sutures: Secondary | ICD-10-CM | POA: Insufficient documentation

## 2016-12-15 NOTE — ED Provider Notes (Signed)
[facility] Emergency Department Provider Note ____________________________________________  Time seen: 11:50 AM.  I have reviewed the triage vital signs and the nursing notes.  HISTORY  Chief Complaint  Suture / Staple Removal   HPI Erin Rowe is a 66 y.o. female 's here for staple removal. Patient states she has not had any difficulty.    Past Medical History:  Diagnosis Date  . Allergy   . GERD (gastroesophageal reflux disease)   . Vertigo     Patient Active Problem List   Diagnosis Date Noted  . Hyperglycemia 08/28/2015  . Advance care planning 07/01/2015  . Medicare annual wellness visit, initial 07/01/2015  . GERD (gastroesophageal reflux disease) 07/01/2015  . Benign paroxysmal positional vertigo 07/01/2015    Past Surgical History:  Procedure Laterality Date  . ABDOMINAL HYSTERECTOMY    . COLONOSCOPY WITH PROPOFOL N/A 05/31/2015   Procedure: COLONOSCOPY WITH PROPOFOL;  Surgeon: Scot Junobert T Elliott, MD;  [facility] ENDOSCOPY;  Service: Endoscopy;  Laterality: N/A;    Prior to Admission medications   Medication Sig Start Date End Date Taking? Authorizing Provider  aspirin 81 MG tablet Take 81 mg by mouth daily as needed for pain.    [provider]  meclizine (ANTIVERT) 50 MG tablet Take 1 tablet (50 mg total) by mouth 3 (three) times daily as needed. 05/09/15   Jeanmarie PlantMcShane, James A, MD  omeprazole (PRILOSEC) 20 MG capsule Take 20 mg by mouth daily.    [provider]    Allergies Penicillins  Family History  Problem Relation Age of Onset  . Heart disease Mother   . Hypertension Mother   . Kidney disease Mother   . Stroke Mother   . Colon cancer Mother   . Prostate cancer Father   . Dementia Father   . Breast cancer Neg Hx     Social History Social History  Substance Use Topics  . Smoking status: Never Smoker  . Smokeless tobacco: Never Used  . Alcohol use No    Review of Systems  Constitutional:  Negative for fever. Eyes: Negative for visual changes. Cardiovascular: Negative for chest pain. Respiratory: Negative for shortness of breath. Skin: Positive for healing laceration. Neurological: Negative for headaches, focal weakness or numbness. ____________________________________________  PHYSICAL EXAM:  VITAL SIGNS: ED Triage Vitals  Enc Vitals Group     BP 12/15/16 1105 (!) 148/80     Pulse Rate 12/15/16 1105 92     Resp 12/15/16 1105 16     Temp 12/15/16 1105 97.9 F (36.6 C)     Temp Source 12/15/16 1105 Oral     SpO2 12/15/16 1105 100 %     Weight 12/15/16 1059 206 lb (93.4 kg)     Height --      Head Circumference --      Peak Flow --      Pain Score --      Pain Loc --      Pain Edu? --      Excl. in GC? --     Constitutional: Alert and oriented. Well appearing and in no distress. Head: Normocephalic and atraumatic. Cardiovascular: Normal rate, regular rhythm. Normal distal pulses. Respiratory: Normal respiratory effort.  Musculoskeletal: Moves upper and lower extremities without any difficulty. Normal gait was noted. Neurologic:  Normal gait without ataxia. Normal speech and language. No gross focal neurologic deficits are appreciated. Skin:  Skin is warm, dry. Scalp laceration is healed without any evidence of infection. Psychiatric: Mood and  affect are normal. Patient exhibits appropriate insight and judgment. ____________________________________________ INITIAL IMPRESSION / ASSESSMENT AND PLAN / ED COURSE  Staples were removed and patient was discharged with instructions to Follow-up with her PCP if any continued problems.    ____________________________________________  FINAL CLINICAL IMPRESSION(S) / ED DIAGNOSES  Final diagnoses:  Encounter for staple removal     Tommi Rumps, PA-C 12/15/16 1607    Tommi Rumps, PA-C 12/15/16 1608    Emily Filbert, MD 12/15/16 [phone]

## 2016-12-15 NOTE — ED Triage Notes (Signed)
Pt here for staple removal from back of head

## 2016-12-18 DIAGNOSIS — H2513 Age-related nuclear cataract, bilateral: Secondary | ICD-10-CM | POA: Diagnosis not present

## 2016-12-29 ENCOUNTER — Ambulatory Visit
Admission: RE | Admit: 2016-12-29 | Discharge: 2016-12-29 | Disposition: A | Discharge status: Home / Self Care | Payer: PPO | Source: Ambulatory Visit | Attending: Family Medicine | Admitting: Family Medicine

## 2016-12-29 ENCOUNTER — Encounter: Payer: Self-pay | Admitting: Radiology

## 2016-12-29 ENCOUNTER — Encounter: Payer: Self-pay | Admitting: *Deleted

## 2016-12-29 DIAGNOSIS — Z1231 Encounter for screening mammogram for malignant neoplasm of breast: Secondary | ICD-10-CM | POA: Diagnosis not present

## 2016-12-29 DIAGNOSIS — Z13820 Encounter for screening for osteoporosis: Secondary | ICD-10-CM | POA: Diagnosis not present

## 2016-12-29 DIAGNOSIS — E2839 Other primary ovarian failure: Secondary | ICD-10-CM | POA: Diagnosis not present

## 2016-12-30 ENCOUNTER — Ambulatory Visit (INDEPENDENT_AMBULATORY_CARE_PROVIDER_SITE_OTHER): Payer: PPO | Admitting: Family Medicine

## 2016-12-30 ENCOUNTER — Encounter: Payer: Self-pay | Admitting: Family Medicine

## 2016-12-30 VITALS — BP 106/74 | HR 89 | Temp 98.4°F | Wt 207.5 lb

## 2016-12-30 DIAGNOSIS — Z91030 Bee allergy status: Secondary | ICD-10-CM

## 2016-12-30 DIAGNOSIS — Z91038 Other insect allergy status: Secondary | ICD-10-CM

## 2016-12-30 DIAGNOSIS — S060X9A Concussion with loss of consciousness of unspecified duration, initial encounter: Secondary | ICD-10-CM

## 2016-12-30 MED ORDER — EPINEPHRINE 0.3 MG/0.3ML IJ SOAJ: 0.3000 mg | Freq: Once | INTRAMUSCULAR | 0 refills | Status: AC

## 2016-12-30 MED ORDER — CYCLOBENZAPRINE HCL 10 MG PO TABS: 5.0000 mg | ORAL_TABLET | Freq: Three times a day (TID) | ORAL | 0 refills | Status: DC | PRN

## 2016-12-30 NOTE — Patient Instructions (Signed)
Shirlee LimerickMarion will call about your referral. See her on the way out.   Use flexeril for headaches.  Sedation caution.  Rest as much as you can in the meantime.  Please call in.  Thanks.  Update me in a few days.

## 2016-12-30 NOTE — Progress Notes (Signed)
ER f/u.  ER course d/w pt.    She fell backward after a bee sting, presumed LOC at the time.  She woke up on the floor.  Scalp lac repaired with staples, removed after about 10 days.  R knee lac healed in the meantime.    H/o anaphylaxis with bee stings.  Needs rx for epi pen. Done at OV today. No swelling with this episode.  No other lip or tongue swelling.  Not SOB, no wheeze.  Hand is back to normal, at the bite site.    Still having headaches.  Throbbing.  Sunlight sensitive.  Posterior and B frontal HA.  Some nausea, a little; no vomiting.  No blurry vision.  She had eye clinic eval, no sig findings per patient. Concentrating makes the HA worse.    PMH and SH reviewed  ROS: Per HPI unless specifically indicated in ROS section   Meds, vitals, and allergies reviewed.   GEN: nad, alert and oriented HEENT: mucous membranes moist, scalp laceration well healed., OP wnl, PERRL EMOI NECK: supple w/o LA CV: rrr.  PULM: ctab, no inc wob ABD: soft, +bs EXT: no edema SKIN: no acute rash CN 2-12 wnl B, S/S/DTR wnl x4

## 2016-12-31 ENCOUNTER — Ambulatory Visit (INDEPENDENT_AMBULATORY_CARE_PROVIDER_SITE_OTHER)
Admission: RE | Admit: 2016-12-31 | Discharge: 2016-12-31 | Disposition: A | Discharge status: Home / Self Care | Payer: PPO | Source: Ambulatory Visit | Attending: Family Medicine | Admitting: Family Medicine

## 2016-12-31 DIAGNOSIS — Z91030 Bee allergy status: Secondary | ICD-10-CM | POA: Insufficient documentation

## 2016-12-31 DIAGNOSIS — S060X9A Concussion with loss of consciousness of unspecified duration, initial encounter: Secondary | ICD-10-CM | POA: Diagnosis not present

## 2016-12-31 DIAGNOSIS — S060X0A Concussion without loss of consciousness, initial encounter: Secondary | ICD-10-CM | POA: Diagnosis not present

## 2016-12-31 NOTE — Assessment & Plan Note (Signed)
According to the patient she did have at least a brief loss of consciousness with the fall. Even without that, it would be presumed that she had a concussion a stone the persistence of possible postconcussion symptoms. Reasonable to check head CT. Concussion pathophysiology discussed with patient. No apparent issues with scalp healing in the meantime. Her motor and neurologic exam is still normal. Routine concussion instructions given. Okay to try Flexeril with routine cautions in the meantime for headache. Okay for outpatient follow-up. >25 minutes spent in face to face time with patient, >50% spent in counselling or coordination of care.

## 2016-12-31 NOTE — Assessment & Plan Note (Signed)
No residual symptoms from the bee sting are apparent at this point. Discussed with patient. Prescription done for EpiPen. Routine cautions given.

## 2017-04-10 ENCOUNTER — Encounter: Payer: Self-pay | Admitting: Emergency Medicine

## 2017-04-10 ENCOUNTER — Emergency Department
Admission: EM | Admit: 2017-04-10 | Discharge: 2017-04-10 | Disposition: A | Discharge status: Home / Self Care | Payer: PPO | Attending: Emergency Medicine | Admitting: Emergency Medicine

## 2017-04-10 DIAGNOSIS — R42 Dizziness and giddiness: Secondary | ICD-10-CM | POA: Diagnosis not present

## 2017-04-10 LAB — CBC WITH DIFFERENTIAL/PLATELET
Basophils Absolute: 0.1 10*3/uL (ref 0–0.1)
Basophils Relative: 1 %
EOS PCT: 1 %
Eosinophils Absolute: 0.1 10*3/uL (ref 0–0.7)
HCT: 43 % (ref 35.0–47.0)
Hemoglobin: 14.2 g/dL (ref 12.0–16.0)
LYMPHS ABS: 2.2 10*3/uL (ref 1.0–3.6)
Lymphocytes Relative: 32 %
MCH: 28.9 pg (ref 26.0–34.0)
MCHC: 33.1 g/dL (ref 32.0–36.0)
MCV: 87.3 fL (ref 80.0–100.0)
MONO ABS: 0.4 10*3/uL (ref 0.2–0.9)
MONOS PCT: 6 %
Neutro Abs: 4.2 10*3/uL (ref 1.4–6.5)
Neutrophils Relative %: 60 %
PLATELETS: 226 10*3/uL (ref 150–440)
RBC: 4.92 MIL/uL (ref 3.80–5.20)
RDW: 14.3 % (ref 11.5–14.5)
WBC: 7 10*3/uL (ref 3.6–11.0)

## 2017-04-10 LAB — COMPREHENSIVE METABOLIC PANEL
ALT: 19 U/L (ref 14–54)
ANION GAP: 9 (ref 5–15)
AST: 21 U/L (ref 15–41)
Albumin: 3.9 g/dL (ref 3.5–5.0)
Alkaline Phosphatase: 63 U/L (ref 38–126)
BUN: 12 mg/dL (ref 6–20)
CO2: 26 mmol/L (ref 22–32)
Calcium: 8.9 mg/dL (ref 8.9–10.3)
Chloride: 105 mmol/L (ref 101–111)
Creatinine, Ser: 0.66 mg/dL (ref 0.44–1.00)
GFR calc non Af Amer: >60 mL/min (ref >60)
Glucose, Bld: 106 mg/dL — ABNORMAL HIGH (ref 65–99)
POTASSIUM: 4 mmol/L (ref 3.5–5.1)
SODIUM: 140 mmol/L (ref 135–145)
Total Bilirubin: 0.9 mg/dL (ref 0.3–1.2)
Total Protein: 7.3 g/dL (ref 6.5–8.1)

## 2017-04-10 MED ORDER — DIAZEPAM 5 MG PO TABS: 5.0000 mg | ORAL_TABLET | Freq: Three times a day (TID) | ORAL | 0 refills | Status: DC | PRN

## 2017-04-10 MED ORDER — DIAZEPAM 5 MG PO TABS
5.0000 mg | ORAL_TABLET | Freq: Once | ORAL | Status: AC
  Administered 2017-04-10: 5 mg via ORAL
  Filled 2017-04-10: qty 1

## 2017-04-10 MED ORDER — MECLIZINE HCL 25 MG PO TABS
50.0000 mg | ORAL_TABLET | Freq: Once | ORAL | Status: AC
  Administered 2017-04-10: 50 mg via ORAL
  Filled 2017-04-10: qty 2

## 2017-04-10 MED ORDER — SODIUM CHLORIDE 0.9 % IV SOLN
Freq: Once | INTRAVENOUS | Status: AC
  Administered 2017-04-10: 09:00:00 via INTRAVENOUS

## 2017-04-10 MED ORDER — MECLIZINE HCL 25 MG PO TABS: 25.0000 mg | ORAL_TABLET | Freq: Three times a day (TID) | ORAL | 1 refills | Status: DC | PRN

## 2017-04-10 NOTE — ED Notes (Signed)
Pt stating that she has been having a mild HA along with vertigo for the last 2 days. Pt's bilateral eyes are shifting back and forth in a "ticking" motion as she is speaking. Pt stating that she has a hx of vertigo and has been taking her medication but with no relief. Pt stating that it is worse with standing. Pt placed on cardiac monitor.

## 2017-04-10 NOTE — ED Triage Notes (Signed)
Pt c/o feeling lightheaded last 2 days with mild headache. Hx vertigo but meds not helping. Sx worse when changing position and stands up.  Describes more as lightheaded than room spinning. No weakness. Speech clear.  Reports BP was elevated at home. Ambulatory without difficulty to check in desk.

## 2017-04-10 NOTE — ED Notes (Signed)
Pt stating that she is feeling better. Pt's eyes are now not moving back and forth. Pt appears to be much more comfortable at this time.

## 2017-04-10 NOTE — ED Provider Notes (Signed)
[facility] Emergency Department Provider Note       Time seen: ----------------------------------------- 9:18 AM on 04/10/2017 -----------------------------------------     I have reviewed the triage vital signs and the nursing notes.   HISTORY   Chief Complaint Dizziness    HPI Erin Rowe is a 66 y.o. female who presents to the ED for lightheadedness and mild headache. Patient describes a long history of vertigo and she states she's been taking her vertigo medications but it's not helping. Patient states symptoms are worse when she is changing positions standing up. She describes it more like lightheadedness than room spinning. She's not had any numbness, tingling or weakness. She presents ambulatory to the ER check in.   Past Medical History:  Diagnosis Date  . Allergy   . GERD (gastroesophageal reflux disease)   . Vertigo     Patient Active Problem List   Diagnosis Date Noted  . 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 Concussion with loss of consciousness 12/31/2016  . Hyperglycemia 08/28/2015  . Advance care planning 07/01/2015  . Medicare annual wellness visit, initial 07/01/2015  . GERD (gastroesophageal reflux disease) 07/01/2015  . Benign paroxysmal positional vertigo 07/01/2015    Past Surgical History:  Procedure Laterality Date  . ABDOMINAL HYSTERECTOMY    . COLONOSCOPY WITH PROPOFOL N/A 05/31/2015   Procedure: COLONOSCOPY WITH PROPOFOL;  Surgeon: Scot Jun, MD;  Location: [facility] ENDOSCOPY;  Service: Endoscopy;  Laterality: N/A;    Allergies Bee venom and Penicillins  Social History Social History  Substance Use Topics  . Smoking status: Never Smoker  . Smokeless tobacco: Never Used  . Alcohol use No    Review of Systems Constitutional: Negative for fever. Eyes: Negative for vision changes ENT:  Negative for congestion, sore throat Cardiovascular: Negative for chest pain. Respiratory: Negative for shortness of  breath. Gastrointestinal: Negative for abdominal pain, vomiting and diarrhea. Genitourinary: Negative for dysuria. Musculoskeletal: Negative for back pain. Skin: Negative for rash. Neurological: Negative for headaches, focal weakness or numbness.Positive for room spinning  All systems negative/normal/unremarkable except as stated in the HPI  ____________________________________________   PHYSICAL EXAM:  VITAL SIGNS: ED Triage Vitals  Enc Vitals Group     BP 04/10/17 0914 (!) 150/91     Pulse Rate 04/10/17 0914 95     Resp 04/10/17 0914 18     Temp 04/10/17 0914 98.1 F (36.7 C)     Temp Source 04/10/17 0914 Oral     SpO2 04/10/17 0914 98 %     Weight 04/10/17 0912 210 lb (95.3 kg)     Height 04/10/17 0912  (1.702 m)     Head Circumference --      Peak Flow --      Pain Score 04/10/17 0912 2     Pain Loc --      Pain Edu? --      Excl. in GC? --    Constitutional: Alert and oriented. Well appearing and in no distress. Eyes: Conjunctivae are normal. Horizontal beating nystagmus is noted ENT   Head: Normocephalic and atraumatic.   Nose: No congestion/rhinnorhea.   Mouth/Throat: Mucous membranes are moist.   Neck: No stridor. Cardiovascular: Normal rate, regular rhythm. No murmurs, rubs, or gallops. Respiratory: Normal respiratory effort without tachypnea nor retractions. Breath sounds are clear and equal bilaterally. No wheezes/rales/rhonchi. Gastrointestinal: Soft and nontender. Normal bowel sounds Musculoskeletal: Nontender with normal range of motion in extremities. No lower extremity tenderness nor edema. Neurologic:  Normal  speech and language. No gross focal neurologic deficits are appreciated.  Skin:  Skin is warm, dry and intact. No rash noted. Psychiatric: Mood and affect are normal. Speech and behavior are normal.  ____________________________________________  EKG: Interpreted by me.Sinus rhythm rate 81 bpm, normal PR interval, normal QRS,  normal QT.  ____________________________________________  ED COURSE:  Pertinent labs & imaging results that were available during my care of the patient were reviewed by me and considered in my medical decision making (see chart for details). Patient presents for vertigo and perhaps lightheadedness, we will assess with labs and imaging as indicated. Patient will receive IV fluids, antiemetics, meclizine and Valium Clinical Course as of Apr 10 1116  Fri Apr 10, 2017  1007 Patient had tried the Epley maneuver at home without success. Currently she is feeling better after fluids and oral medicines.  [JW]    Clinical Course User Index [JW] Emily Filbert, MD   Procedures ____________________________________________   LABS (pertinent positives/negatives)  Labs Reviewed  COMPREHENSIVE METABOLIC PANEL - Abnormal; Notable for the following:       Result Value   Glucose, Bld 106 (*)    All other components within normal limits  CBC WITH DIFFERENTIAL/PLATELET    ____________________________________________  FINAL ASSESSMENT AND PLAN  Vertigo   Plan: Patient's labs were dictated above. Patient had presented for Symptoms of vertigo which have improved. She no longer has horizontal nystagmus and is feeling better. I will add Valium to her meclizine regimen. She is stable for outpatient follow-up.   Emily Filbert, MD   Note: This note was generated in part or whole with voice recognition software. Voice recognition is usually quite accurate but there are transcription errors that can and very often do occur. I apologize for any typographical errors that were not detected and corrected.     Emily Filbert, MD 04/10/17 [phone]

## 2017-04-13 ENCOUNTER — Encounter: Payer: Self-pay | Admitting: Internal Medicine

## 2017-04-13 ENCOUNTER — Ambulatory Visit (INDEPENDENT_AMBULATORY_CARE_PROVIDER_SITE_OTHER): Payer: PPO | Admitting: Internal Medicine

## 2017-04-13 VITALS — BP 136/84 | HR 81 | Temp 98.0°F | Wt 209.8 lb

## 2017-04-13 DIAGNOSIS — R0781 Pleurodynia: Secondary | ICD-10-CM

## 2017-04-13 DIAGNOSIS — Y92009 Unspecified place in unspecified non-institutional (private) residence as the place of occurrence of the external cause: Secondary | ICD-10-CM

## 2017-04-13 DIAGNOSIS — H8110 Benign paroxysmal vertigo, unspecified ear: Secondary | ICD-10-CM | POA: Diagnosis not present

## 2017-04-13 DIAGNOSIS — W19XXXA Unspecified fall, initial encounter: Secondary | ICD-10-CM | POA: Diagnosis not present

## 2017-04-13 MED ORDER — NAPROXEN 500 MG PO TBEC: 500.0000 mg | DELAYED_RELEASE_TABLET | Freq: Two times a day (BID) | ORAL | 0 refills | Status: DC

## 2017-04-13 NOTE — Patient Instructions (Signed)

## 2017-04-13 NOTE — Progress Notes (Signed)
Subjective:    Patient ID: Erin Rowe, female    DOB: 09/21/1950, 66 y.o.   MRN: [id number]  HPI  Pt presents to the clinic today for ER followup. She went to the ER 9/14 with c/o lightheadedness, dizziness. This seemed worse with head movements and position changes. She has a longstanding history of BPPV, had tried Meclizine without relief. She was treated with IV fluids and Valium. No imaging was done. Labs were unremarkable. She was given a RX for Valium in addition to her Meclizine, and advised to follow up with her PCP. Since discharge, she report she has remained dizzy. She reports she was so dizzy this morning, she fell while she was in the shower. She landed on her left side and has been having pain in the left side of her ribs since. She describes the pain as achy. The pain does not radiate. She has not taken anything OTC for this.  Review of Systems      Past Medical History:  Diagnosis Date  . Allergy   . GERD (gastroesophageal reflux disease)   . Vertigo     Current Outpatient Prescriptions  Medication Sig Dispense Refill  . aspirin 81 MG tablet Take 81 mg by mouth daily as needed for pain.    . cyclobenzaprine (FLEXERIL) 10 MG tablet Take 0.5-1 tablets (5-10 mg total) by mouth 3 (three) times daily as needed (for headaches.  sedation caution). 30 tablet 0  . diazepam (VALIUM) 5 MG tablet Take 1 tablet (5 mg total) by mouth every 8 (eight) hours as needed (Vertigo). 20 tablet 0  . meclizine (ANTIVERT) 25 MG tablet Take 1 tablet (25 mg total) by mouth 3 (three) times daily as needed for dizziness or nausea. 30 tablet 1  . meclizine (ANTIVERT) 50 MG tablet Take 1 tablet (50 mg total) by mouth 3 (three) times daily as needed. 30 tablet 0  . omeprazole (PRILOSEC) 20 MG capsule Take 20 mg by mouth daily.     No current facility-administered medications for this visit.     Allergies  Allergen Reactions  . Bee Venom Anaphylaxis  . Penicillins Rash    Family History    Problem Relation Age of Onset  . Heart disease Mother   . Hypertension Mother   . Kidney disease Mother   . Stroke Mother   . Colon cancer Mother   . Prostate cancer Father   . Dementia Father   . Breast cancer Paternal Aunt     Social History   Social History  . Marital status: Married    Spouse name: N/A  . Number of children: N/A  . Years of education: N/A   Occupational History  . Not on file.   Social History Main Topics  . Smoking status: Never Smoker  . Smokeless tobacco: Never Used  . Alcohol use No  . Drug use: No  . Sexual activity: Not on file   Other Topics Concern  . Not on file   Social History Narrative   Married 1972    2 daughters (1 in [place], 1 in [place])     Constitutional: Denies fever, malaise, fatigue, headache or abrupt weight changes.  Respiratory: Denies difficulty breathing, shortness of breath, cough or sputum production.   Cardiovascular: Denies chest pain, chest tightness, palpitations or swelling in the hands or feet.  Musculoskeletal: Pt reports rib pain. Denies decrease in range of motion, difficulty with gait, or joint swelling.  Skin: Denies redness, rashes, lesions or  ulcercations.  Neurological: Pt reports dizziness. Denies difficulty with memory, difficulty with speech or problems with balance and coordination.   No other specific complaints in a complete review of systems (except as listed in HPI above).  Objective:   Physical Exam   BP 136/84   Pulse 81   Temp 98 F (36.7 C) (Oral)   Wt 209 lb 12 oz (95.1 kg)   SpO2 99%   BMI 32.85 kg/m  Wt Readings from Last 3 Encounters:  04/13/17 209 lb 12 oz (95.1 kg)  04/10/17 210 lb (95.3 kg)  12/30/16 207 lb 8 oz (94.1 kg)    General: Appears her stated age, obese in NAD. Neck:  Neck supple, trachea midline. No masses, lumps or thyromegaly present.  Cardiovascular: Normal rate and rhythm. S1,S2 noted.  No murmur, rubs or gallops noted. Pulmonary/Chest: Normal effort and  positive vesicular breath sounds. No respiratory distress. No wheezes, rales or ronchi noted.  Musculoskeletal: Decreased flexion and extension of the spine. Normal rotation. No bony tenderness noted over the lumbar spine. Pain with palpation over the left lateral ribs. Neurological: Alert and oriented.   BMET    Component Value Date/Time   NA 140 04/10/2017 0921   NA 140 02/07/2013 0916   K 4.0 04/10/2017 0921   K 3.8 02/07/2013 0916   CL 105 04/10/2017 0921   CL 107 02/07/2013 0916   CO2 26 04/10/2017 0921   CO2 29 02/07/2013 0916   GLUCOSE 106 (H) 04/10/2017 0921   GLUCOSE 104 (H) 02/07/2013 0916   BUN 12 04/10/2017 0921   BUN 15 02/07/2013 0916   CREATININE 0.66 04/10/2017 0921   CREATININE 0.70 02/07/2013 0916   CALCIUM 8.9 04/10/2017 0921   CALCIUM 8.8 02/07/2013 0916   GFRNONAA >60 04/10/2017 0921   GFRNONAA >60 02/07/2013 0916   GFRAA >60 04/10/2017 0921   GFRAA >60 02/07/2013 0916    Lipid Panel     Component Value Date/Time   CHOL 211 (H) 11/14/2016 1205   TRIG 120.0 11/14/2016 1205   HDL 56.70 11/14/2016 1205   CHOLHDL 4 11/14/2016 1205   VLDL 24.0 11/14/2016 1205   LDLCALC 130 (H) 11/14/2016 1205    CBC    Component Value Date/Time   WBC 7.0 04/10/2017 0921   RBC 4.92 04/10/2017 0921   HGB 14.2 04/10/2017 0921   HGB 12.7 02/07/2013 0916   HCT 43.0 04/10/2017 0921   HCT 38.5 02/07/2013 0916   PLT 226 04/10/2017 0921   PLT 207 02/07/2013 0916   MCV 87.3 04/10/2017 0921   MCV 87 02/07/2013 0916   MCH 28.9 04/10/2017 0921   MCHC 33.1 04/10/2017 0921   RDW 14.3 04/10/2017 0921   RDW 13.8 02/07/2013 0916   LYMPHSABS 2.2 04/10/2017 0921   MONOABS 0.4 04/10/2017 0921   EOSABS 0.1 04/10/2017 0921   BASOSABS 0.1 04/10/2017 0921    Hgb A1C Lab Results  Component Value Date   HGBA1C 6.1 11/14/2016           Assessment & Plan:   ER Follow Up for Dizziness, Lightheadedness, BPPV:  ER notes, and labs reviewed Orthostatics negative Referral  placed to vestibular rehab Stay well hydrated Continue Meclizine and Valium as needed  Left Side Rib Pain, s/p Fall:  Encouraged her to alternate heat and ice eRx for Naproxen BID with food for inflammation She has Flexeril on hand to take as needed  Return precautions discussed  Nicki Reaper, NP

## 2017-04-14 ENCOUNTER — Emergency Department: Payer: PPO

## 2017-04-14 DIAGNOSIS — Z79899 Other long term (current) drug therapy: Secondary | ICD-10-CM | POA: Insufficient documentation

## 2017-04-14 DIAGNOSIS — Z7982 Long term (current) use of aspirin: Secondary | ICD-10-CM | POA: Insufficient documentation

## 2017-04-14 DIAGNOSIS — S2232XA Fracture of one rib, left side, initial encounter for closed fracture: Secondary | ICD-10-CM | POA: Insufficient documentation

## 2017-04-14 DIAGNOSIS — Y999 Unspecified external cause status: Secondary | ICD-10-CM | POA: Insufficient documentation

## 2017-04-14 DIAGNOSIS — Y93E1 Activity, personal bathing and showering: Secondary | ICD-10-CM | POA: Diagnosis not present

## 2017-04-14 DIAGNOSIS — W182XXA Fall in (into) shower or empty bathtub, initial encounter: Secondary | ICD-10-CM | POA: Insufficient documentation

## 2017-04-14 DIAGNOSIS — Y92002 Bathroom of unspecified non-institutional (private) residence single-family (private) house as the place of occurrence of the external cause: Secondary | ICD-10-CM | POA: Diagnosis not present

## 2017-04-14 DIAGNOSIS — S299XXA Unspecified injury of thorax, initial encounter: Secondary | ICD-10-CM | POA: Diagnosis present

## 2017-04-14 IMAGING — CR DG RIBS W/ CHEST 3+V*L*
4 series · 4 of 4 positions shown · non-contrast
Comparison: [DATE]

CLINICAL DATA: Fall with left rib pain

EXAM:
LEFT RIBS AND CHEST - 3+ VIEW

[chest pa]
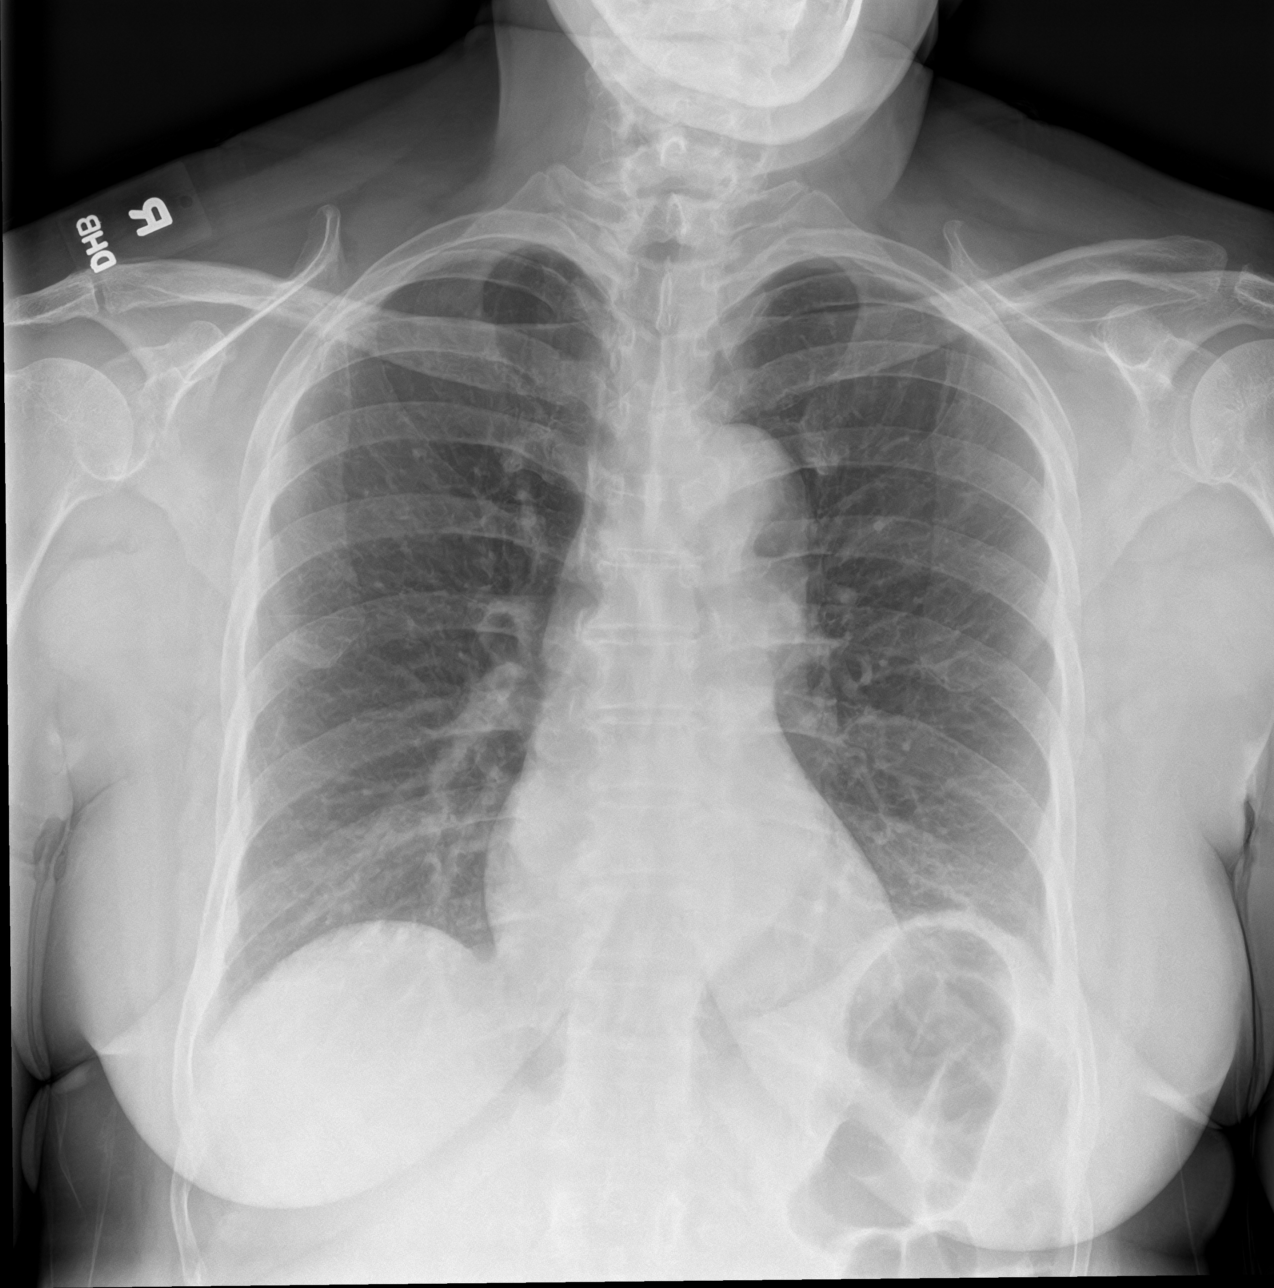

[rib pa]
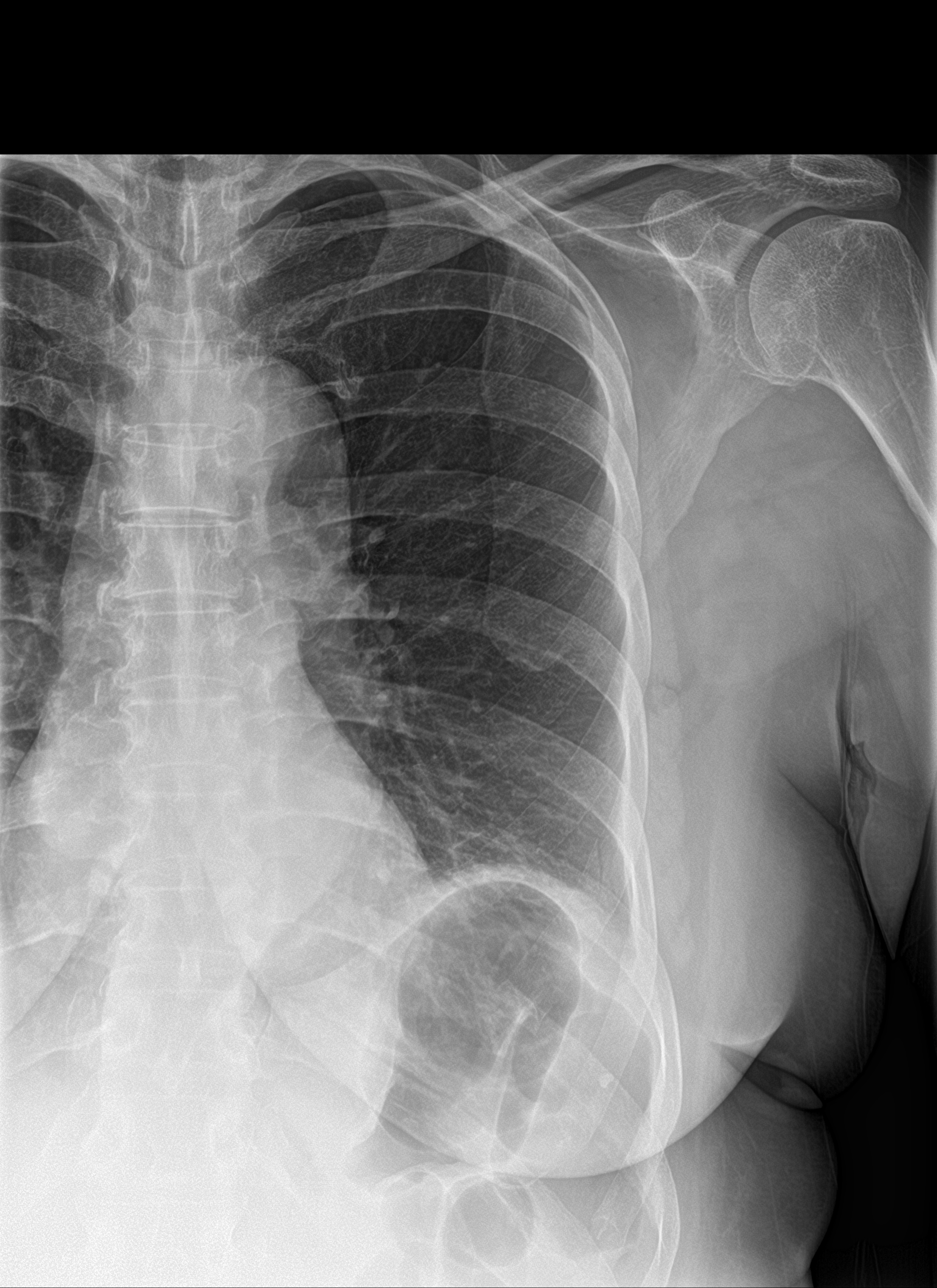

[rib pa obl]
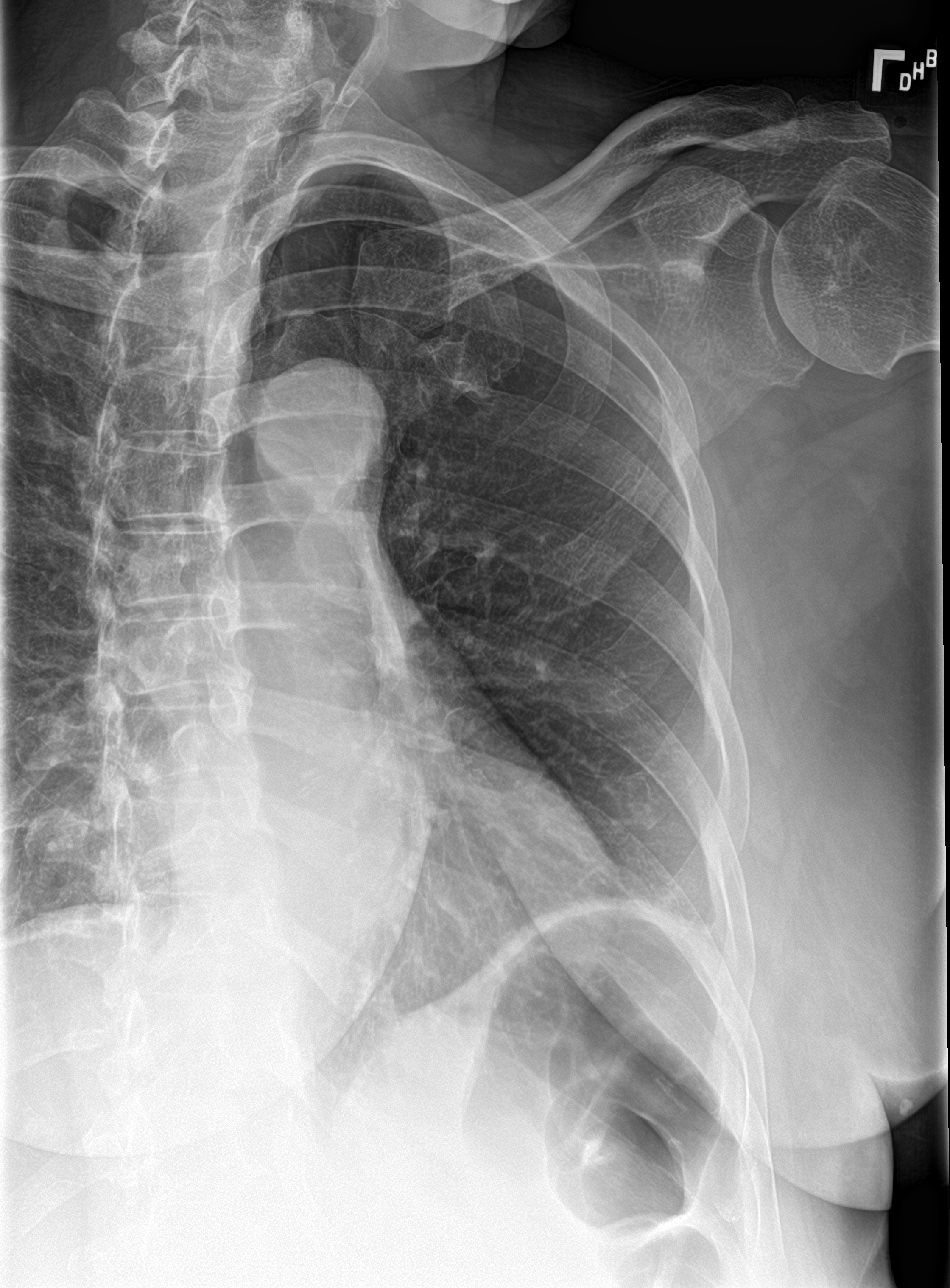

[rib ap]
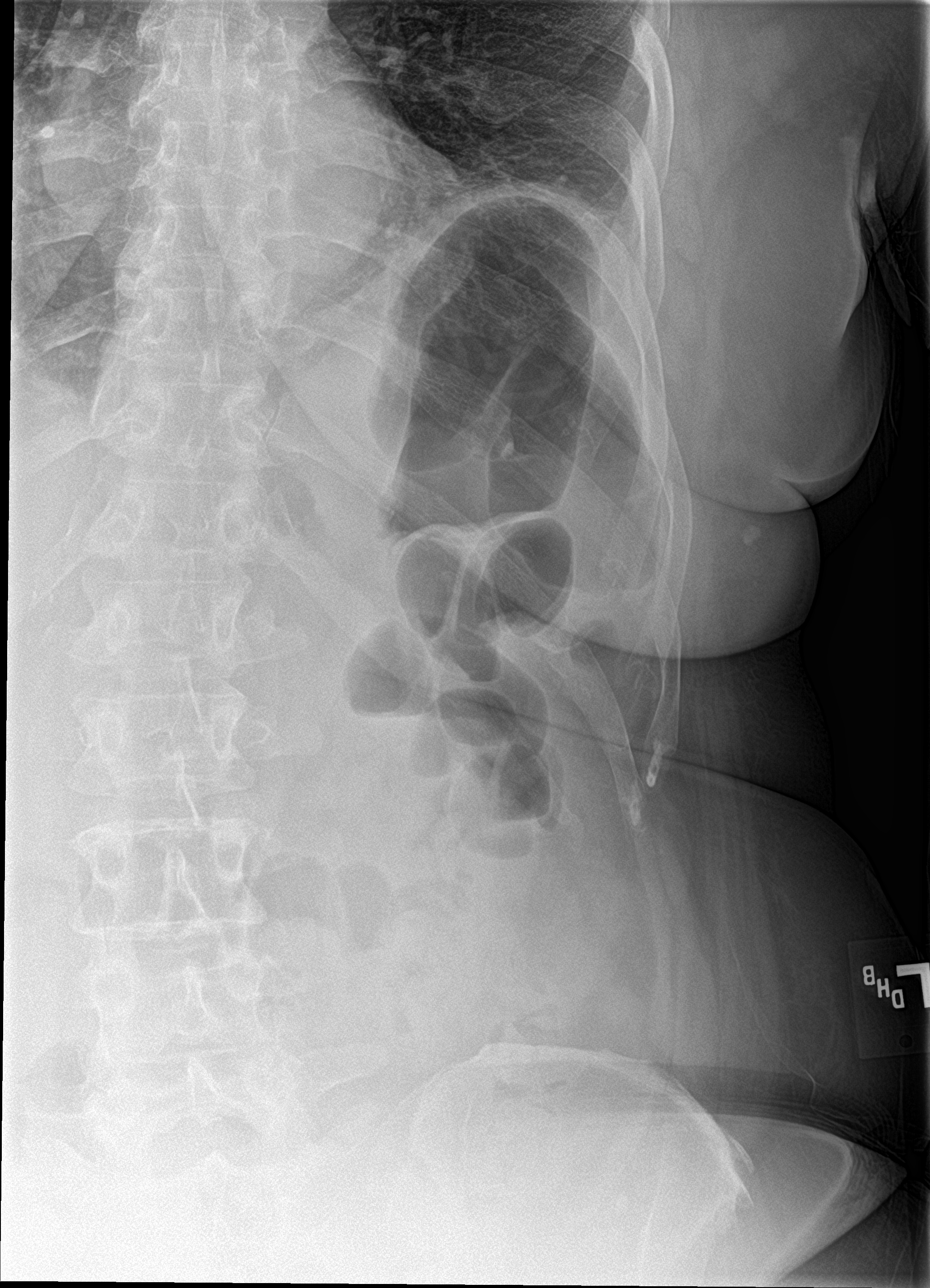

[4 of 4 positions shown; findings below may reference images not displayed]

FINDINGS: Single-view chest demonstrates no acute consolidation or effusion.
Normal cardiomediastinal silhouette. No pneumothorax.

Left rib series demonstrates nondisplaced left seventh rib fracture.
IMPRESSION: 1. Negative for pneumothorax or pleural effusion
2. Nondisplaced left seventh rib fracture

## 2017-04-14 NOTE — ED Triage Notes (Signed)
Pt presents to ED via POV d/t pain s/p mechanical fall on Monday morning when she slipped and fell in the shower. Pt reports she was seen by her PCP that same day and r/x Naproxen, continues to have pain in that area despite taking medication as r/x'd. Pt denies N/V/D, no CP or SHOB, no abdominal pain, no fever. Pt is A&O, in NAD; RR even, regular, and unlabored; skin color/temp is WNL.

## 2017-04-15 ENCOUNTER — Emergency Department
Admission: EM | Admit: 2017-04-15 | Discharge: 2017-04-15 | Disposition: A | Discharge status: Home / Self Care | Payer: PPO | Attending: Emergency Medicine | Admitting: Emergency Medicine

## 2017-04-15 DIAGNOSIS — S2232XA Fracture of one rib, left side, initial encounter for closed fracture: Secondary | ICD-10-CM

## 2017-04-15 MED ORDER — OXYCODONE-ACETAMINOPHEN 5-325 MG PO TABS
2.0000 | ORAL_TABLET | Freq: Once | ORAL | Status: AC
  Administered 2017-04-15: 2 via ORAL

## 2017-04-15 MED ORDER — OXYCODONE-ACETAMINOPHEN 5-325 MG PO TABS: 1.0000 | ORAL_TABLET | ORAL | 0 refills | Status: DC | PRN

## 2017-04-15 MED ORDER — OXYCODONE-ACETAMINOPHEN 5-325 MG PO TABS
ORAL_TABLET | ORAL | Status: AC
  Administered 2017-04-15: 2 via ORAL
  Filled 2017-04-15: qty 2

## 2017-04-15 NOTE — ED Notes (Signed)
Pt reports falling in the shower yesterday.  Pt denies loc.  Pt states her left mid back area hurts.  No bruising or abrasions noted.  Pt denies leg pain, neck pain or headache.  Pt reports increased pain with movement.  Pt alert.  Speech clear.

## 2017-04-15 NOTE — ED Provider Notes (Signed)
[facility] Emergency Department Provider Note   First MD Initiated Contact with Patient 04/15/17 0013     (approximate)  I have reviewed the triage vital signs and the nursing notes.   HISTORY  Chief Complaint Fall   HPI Erin Rowe is a 66 y.o. female With blow list of chronic medical conditions presents to the emergency department history of accidental fall with resultant left chest wall injurywhich occurred on Monday. Patient states that she slipped and fell in the shower resulted in injury. Patient states that she saw her primary care provider today and was diagnosed with a bruised rib and prescribed Naproxen however her pain has continued despite taking medication as prescribed. Patient denies any shortness of breath no fever. Patient denies any abdominal discomfort. Patient denies any head injury during the fall.patient current pain score 10 out of 10 worse with deep inspiration.   Past Medical History:  Diagnosis Date  . Allergy   . GERD (gastroesophageal reflux disease)   . Vertigo     Patient Active Problem List   Diagnosis Date Noted  . 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 Concussion with loss of consciousness 12/31/2016  . Hyperglycemia 08/28/2015  . Advance care planning 07/01/2015  . Medicare annual wellness visit, initial 07/01/2015  . GERD (gastroesophageal reflux disease) 07/01/2015  . Benign paroxysmal positional vertigo 07/01/2015    Past Surgical History:  Procedure Laterality Date  . ABDOMINAL HYSTERECTOMY    . COLONOSCOPY WITH PROPOFOL N/A 05/31/2015   Procedure: COLONOSCOPY WITH PROPOFOL;  Surgeon: Scot Jun, MD;  Location: [facility] ENDOSCOPY;  Service: Endoscopy;  Laterality: N/A;    Prior to Admission medications   Medication Sig Start Date End Date Taking? Authorizing Provider  aspirin 81 MG tablet Take 81 mg by mouth daily as needed for pain.    [provider]  cyclobenzaprine (FLEXERIL) 10 MG tablet Take  0.5-1 tablets (5-10 mg total) by mouth 3 (three) times daily as needed (for headaches.  sedation caution). 12/30/16   Joaquim Nam, MD  diazepam (VALIUM) 5 MG tablet Take 1 tablet (5 mg total) by mouth every 8 (eight) hours as needed (Vertigo). 04/10/17   Emily Filbert, MD  meclizine (ANTIVERT) 25 MG tablet Take 1 tablet (25 mg total) by mouth 3 (three) times daily as needed for dizziness or nausea. 04/10/17   Emily Filbert, MD  meclizine (ANTIVERT) 50 MG tablet Take 1 tablet (50 mg total) by mouth 3 (three) times daily as needed. 05/09/15   Jeanmarie Plant, MD  naproxen (EC NAPROSYN) 500 MG EC tablet Take 1 tablet (500 mg total) by mouth 2 (two) times daily with a meal. 04/13/17   Baity, Salvadore Oxford, NP  omeprazole (PRILOSEC) 20 MG capsule Take 20 mg by mouth daily.    [provider]    Allergies Bee venom and Penicillins  Family History  Problem Relation Age of Onset  . Heart disease Mother   . Hypertension Mother   . Kidney disease Mother   . Stroke Mother   . Colon cancer Mother   . Prostate cancer Father   . Dementia Father   . Breast cancer Paternal Aunt     Social History Social History  Substance Use Topics  . Smoking status: Never Smoker  . Smokeless tobacco: Never Used  . Alcohol use No    Review of Systems Constitutional: No fever/chills Eyes: No visual changes. ENT: No sore throat. Cardiovascular: Denies chest pain.positive for left  chest wall pain Respiratory: Denies shortness of breath. Gastrointestinal: No abdominal pain.  No nausea, no vomiting.  No diarrhea.  No constipation. Genitourinary: Negative for dysuria. Musculoskeletal: Negative for neck pain.  Negative for back pain. Integumentary: Negative for rash. Neurological: Negative for headaches, focal weakness or numbness.   ____________________________________________   PHYSICAL EXAM:  VITAL SIGNS: ED Triage Vitals  Enc Vitals Group     BP 04/14/17 2131 (!) 144/90     Pulse  Rate 04/14/17 2131 86     Resp 04/14/17 2131 17     Temp 04/14/17 2131 99 F (37.2 C)     Temp Source 04/14/17 2131 Oral     SpO2 04/14/17 2131 98 %     Weight 04/14/17 2125 94.8 kg (209 lb)     Height 04/14/17 2125 1.702 m ( )     Head Circumference --      Peak Flow --      Pain Score 04/14/17 2125 10     Pain Loc --      Pain Edu? --      Excl. in GC? --     Constitutional: Alert and oriented. Well appearing and in no acute distress. Eyes: Conjunctivae are normal.  Head: Atraumatic. Mouth/Throat: Mucous membranes are moist.  Oropharynx non-erythematous. Neck: No stridor.   Cardiovascular: Normal rate, regular rhythm. Good peripheral circulation. Grossly normal heart sounds. Left lateral chest wall pain with palpation Respiratory: Normal respiratory effort.  No retractions. Lungs CTAB. Gastrointestinal: Soft and nontender. No distention.  Musculoskeletal: No lower extremity tenderness nor edema. No gross deformities of extremities. Neurologic:  Normal speech and language. No gross focal neurologic deficits are appreciated.  Skin:  Skin is warm, dry and intact. No rash noted. Psychiatric: Mood and affect are normal. Speech and behavior are normal.   RADIOLOGY I, [place] N BROWN, personally viewed and evaluated these images (plain radiographs) as part of my medical decision making, as well as reviewing the written report by the radiologist.  Dg Ribs Unilateral W/chest Left  Result Date: 04/14/2017 CLINICAL DATA:  Fall with left rib pain EXAM: LEFT RIBS AND CHEST - 3+ VIEW COMPARISON:  02/07/2013 FINDINGS: Single-view chest demonstrates no acute consolidation or effusion. Normal cardiomediastinal silhouette. No pneumothorax. Left rib series demonstrates nondisplaced left seventh rib fracture. IMPRESSION: 1. Negative for pneumothorax or pleural effusion 2. Nondisplaced left seventh rib fracture Electronically Signed   By: Jasmine Pang M.D.   On: 04/14/2017 23:09       Procedures   ____________________________________________   INITIAL IMPRESSION / ASSESSMENT AND PLAN / ED COURSE  Pertinent labs & imaging results that were available during my care of the patient were reviewed by me and considered in my medical decision making (see chart for details).  66 year old female presenting with above stated history of physical exam consistent with left rib fracture which was confirmed by x-ray. Patient given [facility] emergency department will be prescribed same for home. Patient advised not to drive or perform any activity that would result in self-harm while taking Percocet as it induces somnolence      ____________________________________________  FINAL CLINICAL IMPRESSION(S) / ED DIAGNOSES  Final diagnoses:  Closed fracture of one rib of left side, initial encounter     MEDICATIONS GIVEN DURING THIS VISIT:  Medications  oxyCODONE-acetaminophen (PERCOCET/ROXICET) 5-325 MG per tablet 2 tablet (2 tablets Oral Given 04/15/17 0051)     NEW OUTPATIENT MEDICATIONS STARTED DURING THIS VISIT:  New Prescriptions   No medications on file  Modified Medications   No medications on file    Discontinued Medications   No medications on file     Note:  This document was prepared using Dragon voice recognition software and may include unintentional dictation errors.    Darci Current, MD 04/15/17 [phone]

## 2017-04-30 ENCOUNTER — Encounter: Payer: Self-pay | Admitting: Family Medicine

## 2017-04-30 ENCOUNTER — Ambulatory Visit (INDEPENDENT_AMBULATORY_CARE_PROVIDER_SITE_OTHER): Payer: PPO | Admitting: Family Medicine

## 2017-04-30 DIAGNOSIS — S2232XD Fracture of one rib, left side, subsequent encounter for fracture with routine healing: Secondary | ICD-10-CM

## 2017-04-30 MED ORDER — TRAMADOL HCL 50 MG PO TABS
50.0000 mg | ORAL_TABLET | Freq: Three times a day (TID) | ORAL | 1 refills | Status: DC | PRN
Start: 2017-04-30 — End: 2022-08-23

## 2017-04-30 MED ORDER — NAPROXEN SODIUM 220 MG PO TABS: 220.0000 mg | ORAL_TABLET | Freq: Two times a day (BID) | ORAL | Status: DC

## 2017-04-30 MED ORDER — ACETAMINOPHEN 500 MG PO TABS: 500.0000 mg | ORAL_TABLET | Freq: Three times a day (TID) | ORAL | Status: DC | PRN

## 2017-04-30 NOTE — Patient Instructions (Signed)
Stop percocet.   1-2 extra strength tylenol up to 3 times a day.  1-2 aleve up to twice a day with food.  1 Tramadol up to 3 times a day for pain.  Take care.  Glad to see you.  Update me as needed.

## 2017-04-30 NOTE — Progress Notes (Signed)
She was in the shower and slipped and fell.  No LOC.  Seen at clinic, rx'd nsaids, had more pain and went to ER.  Rib fx noted on imaging.  Here for f/u.   She didn't want to take oxycodone. No ADE on med, but it didn't help a lot with the pain.  D/w pt. Still with sig L sided chest wal pain, especially with deep breath, cough, laugh, sneeze. No fevers chills nausea vomiting or other's new symptoms.  Meds, vitals, and allergies reviewed.   ROS: Per HPI unless specifically indicated in ROS section   GEN: nad, alert and oriented HEENT: mucous membranes moist NECK: supple w/o LA CV: rrr. PULM: ctab, no inc wob, L chest wall ttp along the lateral mid thorax.  ABD: soft, +bs EXT: no edema

## 2017-05-01 DIAGNOSIS — S2239XA Fracture of one rib, unspecified side, initial encounter for closed fracture: Secondary | ICD-10-CM | POA: Insufficient documentation

## 2017-05-01 NOTE — Assessment & Plan Note (Addendum)
Discussed with patient about imaging, imaging reviewed with patient at office visit. Discussed with patient about pain control and routine timeline for healing.  Stop percocet.   1-2 extra strength tylenol up to 3 times a day.  1-2 aleve up to twice a day with food.  1 Tramadol up to 3 times a day for pain.  Sedation caution. Update me as needed.  Okay for outpatient f/u.

## 2017-11-19 ENCOUNTER — Ambulatory Visit: Payer: PPO

## 2017-11-24 ENCOUNTER — Encounter: Payer: PPO | Admitting: Family Medicine

## 2018-11-08 DIAGNOSIS — R12 Heartburn: Secondary | ICD-10-CM | POA: Diagnosis not present

## 2018-11-08 DIAGNOSIS — R42 Dizziness and giddiness: Secondary | ICD-10-CM | POA: Diagnosis not present

## 2019-02-28 ENCOUNTER — Other Ambulatory Visit: Payer: Self-pay

## 2021-12-16 ENCOUNTER — Other Ambulatory Visit: Payer: Self-pay

## 2021-12-16 ENCOUNTER — Emergency Department
Admission: EM | Admit: 2021-12-16 | Discharge: 2021-12-16 | Disposition: A | Discharge status: Home / Self Care | Payer: Medicare HMO | Attending: Emergency Medicine | Admitting: Emergency Medicine

## 2021-12-16 ENCOUNTER — Encounter: Payer: Self-pay | Admitting: Emergency Medicine

## 2021-12-16 DIAGNOSIS — H5789 Other specified disorders of eye and adnexa: Secondary | ICD-10-CM | POA: Diagnosis present

## 2021-12-16 DIAGNOSIS — H1131 Conjunctival hemorrhage, right eye: Secondary | ICD-10-CM | POA: Diagnosis not present

## 2021-12-16 NOTE — ED Notes (Signed)
Visual Acuity: RIGHT and LEFT eye 20/30

## 2021-12-16 NOTE — Discharge Instructions (Signed)
Follow-up with [facility] if you feel that you are worsening or not improving within 1 week.  Please call for an appointment.  Return emergency department if any change in your vision or severe headache

## 2021-12-16 NOTE — ED Triage Notes (Addendum)
Pt presents via POV with complaints of right eye redness starting yesterday AM following a headache that has progressively gotten worse. She endorses mild irritation but denies pain or vision changes. She notes that the headache resolved but my "blood vessel may have burst". Pt wears prescription glasses.

## 2021-12-16 NOTE — ED Provider Notes (Signed)
[facility] Provider Note    Event Date/Time   First MD Initiated Contact with Patient 12/16/21 0719     (approximate)   History   Eye Problem   HPI  Erin Rowe is a 71 y.o. female with history of vertigo, GERD presents emergency department with redness of the right eye.  Patient states she awoke with rib area to the eye.  No pain or change in vision.  No known injury.  Patient states she used some Visine drops without much relief.  Does not wear contacts      Physical Exam   Triage Vital Signs: ED Triage Vitals  Enc Vitals Group     BP 12/16/21 0609 (!) 161/97     Pulse Rate 12/16/21 0609 84     Resp 12/16/21 0609 18     Temp 12/16/21 0609 98 F (36.7 C)     Temp src --      SpO2 12/16/21 0609 96 %     Weight 12/16/21 0611 218 lb 4.1 oz (99 kg)     Height 12/16/21 0611 5\' 7"  (1.702 m)     Head Circumference --      Peak Flow --      Pain Score 12/16/21 0609 1     Pain Loc --      Pain Edu? --      Excl. in [place]? --     Most recent vital signs: Vitals:   12/16/21 0609  BP: (!) 161/97  Pulse: 84  Resp: 18  Temp: 98 F (36.7 C)  SpO2: 96%     General: Awake, no distress.   CV:  Good peripheral perfusion. regular rate and  rhythm Resp:  Normal effort.  Abd:  No distention.   Other:  Right eye has subconjunctival hemorrhage at the medial aspect, PERRL, EOMI   ED Results / Procedures / Treatments   Labs (all labs ordered are listed, but only abnormal results are displayed) Labs Reviewed - No data to display   EKG     RADIOLOGY     PROCEDURES:   Procedures   MEDICATIONS ORDERED IN ED: Medications - No data to display   IMPRESSION / MDM / [place] / ED COURSE  I reviewed the triage vital signs and the nursing notes.                              Differential diagnosis includes, but is not limited to, subconjunctival hemorrhage, corneal abrasion, glaucoma  Feel that a corneal abrasion glaucoma  or less likely than subconjunctival hemorrhage.  Patient had no pain and no change in vision.  I did explain to her that this would be self-limiting.  However if she begins to have headaches or changes of vision she should return emergency department.  If she is just concerned about the redness that has not resolved in 1 week should follow-up with the eye center.  Patient is in agreement treatment plan.  Discharged stable condition.        FINAL CLINICAL IMPRESSION(S) / ED DIAGNOSES   Final diagnoses:  Subconjunctival hemorrhage of right eye     Rx / DC Orders   ED Discharge Orders     None        Note:  This document was prepared using Dragon voice recognition software and may include unintentional dictation errors.    Versie Starks, PA-C 12/16/21  [id number]    Blake Divine, MD 12/16/21 [phone]

## 2022-08-23 ENCOUNTER — Ambulatory Visit (HOSPITAL_COMMUNITY)
Admission: EM | Admit: 2022-08-23 | Discharge: 2022-08-23 | Disposition: A | Discharge status: Home / Self Care | Payer: Medicare HMO | Attending: Physician Assistant | Admitting: Physician Assistant

## 2022-08-23 ENCOUNTER — Encounter (HOSPITAL_COMMUNITY): Payer: Self-pay

## 2022-08-23 DIAGNOSIS — N3001 Acute cystitis with hematuria: Secondary | ICD-10-CM

## 2022-08-23 DIAGNOSIS — R030 Elevated blood-pressure reading, without diagnosis of hypertension: Secondary | ICD-10-CM

## 2022-08-23 LAB — POCT URINALYSIS DIPSTICK, ED / UC
Bilirubin Urine: NEGATIVE
Glucose, UA: NEGATIVE mg/dL
Hgb urine dipstick: NEGATIVE
Leukocytes,Ua: NEGATIVE
Nitrite: POSITIVE — AB
Protein, ur: NEGATIVE mg/dL
Specific Gravity, Urine: >=1.03 (ref 1.005–1.030)
Urobilinogen, UA: 1 mg/dL (ref 0.0–1.0)
pH: 5.5 (ref 5.0–8.0)

## 2022-08-23 MED ORDER — SULFAMETHOXAZOLE-TRIMETHOPRIM 800-160 MG PO TABS: 1.0000 | ORAL_TABLET | Freq: Two times a day (BID) | ORAL | 0 refills | Status: AC

## 2022-08-23 MED ORDER — AMLODIPINE BESYLATE 5 MG PO TABS: 5.0000 mg | ORAL_TABLET | Freq: Every day | ORAL | 1 refills | Status: DC

## 2022-08-23 NOTE — Discharge Instructions (Signed)
Your urine did show evidence of infection.  Please start sulfamethoxazole-trimethoprim twice daily for 5 days.  If you develop any rash or lesions stop the medication to be seen immediately.  Make sure that you rest and drink plenty of fluid.  We are sending this for culture and we will contact you if we need to change her antibiotics.  If you have any worsening symptoms including abdominal pain, pelvic pain, fever, nausea, vomiting you need to be seen immediately.  Start amlodipine 5 mg daily to help with your blood pressure.  Avoid NSAIDs (aspirin, ibuprofen/Advil, naproxen/Aleve).  Avoid caffeine, sodium, decongestants.  Follow-up with your primary care within the next few weeks for recheck and medication adjustment.  If you have high blood pressure and develop any chest pain, shortness of breath, headache, vision change, dizziness you need to go to the emergency room.

## 2022-08-23 NOTE — ED Notes (Signed)
Located urine in bathroom, urine already labeled .  Placed in lab

## 2022-08-23 NOTE — ED Triage Notes (Signed)
Here for dysuria and lower back pain that started yesterday.

## 2022-08-23 NOTE — ED Provider Notes (Signed)
[place]    CSN: [id number] Arrival date & time: 08/23/22  1700      History   Chief Complaint Chief Complaint  Patient presents with   Dysuria    HPI Erin Rowe is a 72 y.o. female.   Patient presents today with an episode of hematuria that occurred first thing this morning.  Reports that when she woke up she noticed some blood in her urine.  She has not had any urinary frequency, urgency, hematuria, flank pain but has had some mild back pain.  She denies any fever, nausea, vomiting.  She denies history of nephrolithiasis or recurrent UTI.  Denies any recent urogenital procedure, self-catheterization, seeing a urologist in the past.  Denies any recent antibiotics.  She denies history of diabetes and does not take SGLT2 inhibitor.  Denies any pelvic or vaginal symptoms.  In addition, patient was noted to have elevated blood pressure.  Chart review shows this has been persistently elevated.  She denies history of hypertension has not taken antihypertensive medications in the past.  She does currently have a PCP but is not scheduled to see them in the near future.  She denies any headache, chest pain, shortness of breath, vision change, dizziness.  She does report taking 2 Aleve earlier today to help with her pain in the back.  But denies regular NSAID use.  She denies any decongestant, caffeine, sodium use.  She is open to starting a medication.    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      Home Medications    Prior to Admission medications   Medication Sig Start Date End Date Taking? Authorizing Provider  amLODipine (NORVASC) 5 MG tablet Take 1 tablet (5 mg total) by mouth daily. 08/23/22  Yes Nhat Hearne, Derry Skill, PA-C  aspirin 81 MG tablet Take 81 mg by mouth daily as needed for pain.   Yes [provider]  sulfamethoxazole-trimethoprim (BACTRIM DS) 800-160 MG tablet Take 1 tablet by mouth 2 (two) times daily for 5 days. 08/23/22 08/28/22 Yes Lorilynn Lehr, Derry Skill, PA-C  naproxen sodium (ALEVE) 220 MG tablet Take 1-2 tablets (220-440 mg total) by mouth 2 (two) times daily with a meal. 04/30/17   Tonia Ghent, MD    Family History Family History  Problem Relation Age of Onset   Heart disease Mother    Hypertension Mother    Kidney disease Mother    Stroke Mother    Colon cancer Mother    Prostate cancer Father    Dementia Father    Breast cancer Paternal Aunt     Social History Social History   Tobacco Use   Smoking status: Never   Smokeless tobacco: Never  Substance Use Topics   Alcohol use: No   Drug use: No     Allergies   Bee venom and Penicillins   Review of Systems Review of Systems  Constitutional:  Negative for activity change, appetite change, fatigue and fever.  Eyes:  Negative for visual disturbance.  Respiratory:  Negative for cough and shortness of breath.   Cardiovascular:  Negative for chest pain and leg swelling.  Gastrointestinal:  Negative for abdominal pain, diarrhea, nausea and vomiting.  Genitourinary:  Positive for hematuria. Negative for dysuria, frequency, urgency, vaginal bleeding, vaginal discharge and vaginal pain.  Musculoskeletal:   Positive for back pain. Negative for arthralgias and myalgias.  Neurological:  Negative for dizziness, light-headedness and headaches.     Physical Exam Triage Vital Signs ED Triage Vitals  Enc Vitals Group     BP 08/23/22 1808 (!) 189/92     Pulse Rate 08/23/22 1804 82     Resp 08/23/22 1804 18     Temp 08/23/22 1804 98.1 F (36.7 C)     Temp Source 08/23/22 1804 Oral     SpO2 08/23/22 1804 98 %     Weight --      Height --      Head Circumference --      Peak Flow --      Pain Score --      Pain Loc --      Pain Edu? --      Excl. in [place]? --    No data found.  Updated Vital Signs BP (!) 189/92 (BP Location: Right Arm) Comment: 172/100  Pulse 82   Temp 98.1 F (36.7 C) (Oral)   Resp 18   SpO2 98%   Visual Acuity Right Eye Distance:   Left Eye Distance:   Bilateral Distance:    Right Eye Near:   Left Eye Near:    Bilateral Near:     Physical Exam Vitals reviewed.  Constitutional:      General: She is awake. She is not in acute distress.    Appearance: Normal appearance. She is well-developed. She is not ill-appearing.     Comments: Very pleasant female appears stated age in no acute distress sitting comfortably in exam room  HENT:     Head: Normocephalic and atraumatic.  Cardiovascular:     Rate and Rhythm: Normal rate and regular rhythm.     Heart sounds: Normal heart sounds, S1 normal and S2 normal. No murmur heard. Pulmonary:     Effort: Pulmonary effort is normal.     Breath sounds: Normal breath sounds. No wheezing, rhonchi or rales.     Comments: Clear to auscultation bilaterally Abdominal:     General: Bowel sounds are normal.     Palpations: Abdomen is soft.     Tenderness: There is no abdominal tenderness. There is no right CVA tenderness, left CVA tenderness, guarding or rebound.     Comments: Benign abdominal exam  Musculoskeletal:     Cervical back: No tenderness or bony tenderness.     Thoracic back: No tenderness or bony tenderness.      Lumbar back: No tenderness or bony tenderness.     Right lower leg: No edema.     Left lower leg: No edema.  Psychiatric:        Behavior: Behavior is cooperative.      UC Treatments / Results  Labs (all labs ordered are listed, but only abnormal results are displayed) Labs Reviewed  POCT URINALYSIS  DIPSTICK, ED / UC - Abnormal; Notable for the following components:      Result Value   Ketones, ur TRACE (*)    Nitrite POSITIVE (*)    All other components within normal limits  URINE CULTURE    EKG   Radiology No results found.  Procedures Procedures (including critical care time)  Medications Ordered in UC Medications - No data to display  Initial Impression / Assessment and Plan / UC Course  I have reviewed the triage vital signs and the nursing notes.  Pertinent labs & imaging results that were available during my care of the patient were reviewed by me and considered in my medical decision making (see chart for details).     Patient is well-appearing, afebrile, nontoxic, nontachycardic.  She had positive nitrates and reports acute gross hematuria so we will treat with Bactrim DS twice daily for 5 days.  Discussed that if she has any side effects with this medication including rash or lesions she should stop the medication to be seen immediately.  Urine was sent for culture and we discussed potential need to change or stop antibiotics based on culture results.  She is to rest and drink plenty of fluid.  Discussed that if she has any worsening or changing symptoms she needs to be reevaluated immediately.  Strict return precautions given.  Her blood pressure is very elevated.  We discussed potential utility of starting medication and she is agreeable to this.  Will start amlodipine 5 mg.  Recommended she avoid NSAIDs, decongestants, caffeine, sodium.  She is to monitor her blood pressure and keep a log for evaluation at follow-up appointment.  Recommended follow-up with  primary care or our office within a few weeks in order to adjust medication.  Discussed that if she develops any chest pain, shortness of breath, headache, vision change, dizziness in setting of high blood pressure she needs to go to the emergency room immediately.  Final Clinical Impressions(s) / UC Diagnoses   Final diagnoses:  Acute cystitis with hematuria  Elevated blood pressure reading     Discharge Instructions      Your urine did show evidence of infection.  Please start sulfamethoxazole-trimethoprim twice daily for 5 days.  If you develop any rash or lesions stop the medication to be seen immediately.  Make sure that you rest and drink plenty of fluid.  We are sending this for culture and we will contact you if we need to change her antibiotics.  If you have any worsening symptoms including abdominal pain, pelvic pain, fever, nausea, vomiting you need to be seen immediately.  Start amlodipine 5 mg daily to help with your blood pressure.  Avoid NSAIDs (aspirin, ibuprofen/Advil, naproxen/Aleve).  Avoid caffeine, sodium, decongestants.  Follow-up with your primary care within the next few weeks for recheck and medication adjustment.  If you have high blood pressure and develop any chest pain, shortness of breath, headache, vision change, dizziness you need to go to the emergency room.     ED Prescriptions     Medication Sig Dispense Auth. Provider   amLODipine (NORVASC) 5 MG tablet Take 1 tablet (5 mg total) by mouth daily. 30 tablet Rani Sisney K, PA-C   sulfamethoxazole-trimethoprim (BACTRIM DS) 800-160 MG tablet Take 1 tablet by mouth 2 (two) times daily for 5 days. 10 tablet Dequavion Follette, Noberto Retort, PA-C      PDMP not reviewed this encounter.   Jeani Hawking, PA-C 08/23/22 1844

## 2022-08-24 LAB — URINE CULTURE: Culture: <10000 — AB

## 2022-12-10 DIAGNOSIS — I10 Essential (primary) hypertension: Secondary | ICD-10-CM | POA: Diagnosis not present

## 2022-12-16 ENCOUNTER — Ambulatory Visit: Payer: Medicare HMO | Admitting: Physician Assistant

## 2022-12-29 ENCOUNTER — Encounter: Payer: Self-pay | Admitting: Physician Assistant

## 2022-12-29 ENCOUNTER — Ambulatory Visit (INDEPENDENT_AMBULATORY_CARE_PROVIDER_SITE_OTHER): Payer: Medicare HMO | Admitting: Physician Assistant

## 2022-12-29 VITALS — BP 135/62 | HR 88 | Ht 67.0 in | Wt 219.0 lb

## 2022-12-29 DIAGNOSIS — Z131 Encounter for screening for diabetes mellitus: Secondary | ICD-10-CM | POA: Diagnosis not present

## 2022-12-29 DIAGNOSIS — Z1211 Encounter for screening for malignant neoplasm of colon: Secondary | ICD-10-CM

## 2022-12-29 DIAGNOSIS — Z87898 Personal history of other specified conditions: Secondary | ICD-10-CM | POA: Diagnosis not present

## 2022-12-29 DIAGNOSIS — I10 Essential (primary) hypertension: Secondary | ICD-10-CM

## 2022-12-29 DIAGNOSIS — Z860100 Personal history of colon polyps, unspecified: Secondary | ICD-10-CM

## 2022-12-29 DIAGNOSIS — Z13220 Encounter for screening for lipoid disorders: Secondary | ICD-10-CM

## 2022-12-29 DIAGNOSIS — Z1231 Encounter for screening mammogram for malignant neoplasm of breast: Secondary | ICD-10-CM | POA: Diagnosis not present

## 2022-12-29 HISTORY — DX: Personal history of other specified conditions: Z87.898

## 2022-12-29 MED ORDER — AMLODIPINE BESYLATE 5 MG PO TABS: 5.0000 mg | ORAL_TABLET | Freq: Every day | ORAL | 1 refills | Status: DC

## 2022-12-29 NOTE — Patient Instructions (Signed)
Get labs fasting.  Schedule mammogram.  Colonoscopy appt-they will call you.

## 2022-12-29 NOTE — Progress Notes (Signed)
New Patient Office Visit  Subjective    Patient ID: Erin Rowe, female    DOB: April 22, 1951  Age: 72 y.o. MRN: [id number]  CC:  Chief Complaint  Patient presents with   Establish Care    HPI Erin Rowe presents to establish care. She recently went to [facility] in May with elevated BP readings in the 160s over 90s and she was started on norvasc. She denies any problems or concerns. BP better at home. 130s over 70s. No CP, palpitations, headaches or vision changes. She denies any SOB or leg swelling.   Outpatient Encounter Medications as of 12/29/2022  Medication Sig   aspirin 81 MG tablet Take 81 mg by mouth daily as needed for pain.   naproxen sodium (ALEVE) 220 MG tablet Take 1-2 tablets (220-440 mg total) by mouth 2 (two) times daily with a meal.   [DISCONTINUED] amLODipine (NORVASC) 5 MG tablet Take 1 tablet (5 mg total) by mouth daily.   amLODipine (NORVASC) 5 MG tablet Take 1 tablet (5 mg total) by mouth daily.   No facility-administered encounter medications on file as of 12/29/2022.    Past Medical History:  Diagnosis Date   Allergy    GERD (gastroesophageal reflux disease)    History of vertigo 12/29/2022   Hypertension    Vertigo     Past Surgical History:  Procedure Laterality Date   ABDOMINAL HYSTERECTOMY     COLONOSCOPY WITH PROPOFOL N/A 05/31/2015   Procedure: COLONOSCOPY WITH PROPOFOL;  Surgeon: Scot Jun, MD;  Location: [facility] ENDOSCOPY;  Service: Endoscopy;  Laterality: N/A;    Family History  Problem Relation Age of Onset   Heart disease Mother    Hypertension Mother    Kidney disease Mother    Stroke Mother    Colon cancer Mother    Prostate cancer Father    Dementia Father    Breast cancer Paternal Aunt     Social History   Socioeconomic History   Marital status: Married    Spouse name: Not on file   Number of children: Not on file   Years of education: Not on file   Highest education level: Not on file  Occupational History   Not on file   Tobacco Use   Smoking status: Never   Smokeless tobacco: Never  Substance and Sexual Activity   Alcohol use: No   Drug use: No   Sexual activity: Not on file  Other Topics Concern   Not on file  Social History Narrative   Married 1972    2 daughters (1 in [place], 1 in [place])   Social Determinants of Health   Financial Resource Strain: Not on file  Food Insecurity: Not on file  Transportation Needs: Not on file  Physical Activity: Not on file  Stress: Not on file  Social Connections: Not on file  Intimate Partner Violence: Not on file    ROS  See HPI.    Objective    BP 135/62   Pulse 88   Ht 5\' 7"  (1.702 m)   Wt 219 lb (99.3 kg)   SpO2 99%   BMI 34.30 kg/m   .Marland Kitchen    12/29/2022    2:35 PM  Depression screen PHQ 2/9  Decreased Interest 0  Down, Depressed, Hopeless 0  PHQ - 2 Score 0     Physical Exam Constitutional:      Appearance: Normal appearance. She is obese.  HENT:     Head: Normocephalic.  Cardiovascular:     Rate and Rhythm: Normal rate and regular rhythm.  Pulmonary:     Effort: Pulmonary effort is normal.     Breath sounds: Normal breath sounds.  Musculoskeletal:     Right lower leg: No edema.     Left lower leg: No edema.  Neurological:     General: No focal deficit present.     Mental Status: She is alert and oriented to person, place, and time.  Psychiatric:        Mood and Affect: Mood normal.       Assessment & Plan:  Marland KitchenMarland KitchenMarkala was seen today for establish care.  Diagnoses and all orders for this visit:  Primary hypertension -     amLODipine (NORVASC) 5 MG tablet; Take 1 tablet (5 mg total) by mouth daily. -     COMPLETE METABOLIC PANEL WITH GFR -     Lipid Panel w/reflex Direct LDL -     TSH -     CBC w/Diff/Platelet -     MM 3D SCREENING MAMMOGRAM BILATERAL BREAST  Visit for screening mammogram -     MM 3D SCREENING MAMMOGRAM BILATERAL BREAST  Screening for lipid disorders -     Lipid Panel w/reflex Direct  LDL  Screening for diabetes mellitus -     COMPLETE METABOLIC PANEL WITH GFR  History of vertigo  History of colon polyps -     Ambulatory referral to Gastroenterology  Colon cancer screening -     Ambulatory referral to Gastroenterology   BP to goal today. Continue norvasc.  Discussed BP and how to control BP Continue to monitor at home.  Will get fasting labs today.  Hx of vertigo but not giving her any problems.  Mammogram ordered.  Colonoscopy ordered, hx of colon polyps.   Tandy Gaw, PA-C

## 2022-12-30 DIAGNOSIS — Z13220 Encounter for screening for lipoid disorders: Secondary | ICD-10-CM | POA: Diagnosis not present

## 2022-12-30 DIAGNOSIS — Z131 Encounter for screening for diabetes mellitus: Secondary | ICD-10-CM | POA: Diagnosis not present

## 2022-12-30 DIAGNOSIS — I10 Essential (primary) hypertension: Secondary | ICD-10-CM | POA: Diagnosis not present

## 2022-12-30 LAB — CBC WITH DIFFERENTIAL/PLATELET
Absolute Monocytes: 339 cells/uL (ref 200–950)
Basophils Absolute: 42 cells/uL (ref 0–200)
Basophils Relative: 0.8 %
Eosinophils Absolute: 90 cells/uL (ref 15–500)
HCT: 41.4 % (ref 35.0–45.0)
MCH: 29.3 pg (ref 27.0–33.0)
MCHC: 31.9 g/dL — ABNORMAL LOW (ref 32.0–36.0)
Neutro Abs: 3010 cells/uL (ref 1500–7800)
Neutrophils Relative %: 56.8 %
RDW: 13.1 % (ref 11.0–15.0)
Total Lymphocyte: 34.3 %
WBC: 5.3 10*3/uL (ref 3.8–10.8)

## 2022-12-31 ENCOUNTER — Encounter: Payer: Self-pay | Admitting: Physician Assistant

## 2022-12-31 DIAGNOSIS — E7800 Pure hypercholesterolemia, unspecified: Secondary | ICD-10-CM | POA: Insufficient documentation

## 2022-12-31 LAB — COMPLETE METABOLIC PANEL WITH GFR
AG Ratio: 1.4 (calc) (ref 1.0–2.5)
ALT: 18 U/L (ref 6–29)
AST: 21 U/L (ref 10–35)
Albumin: 3.9 g/dL (ref 3.6–5.1)
Alkaline phosphatase (APISO): 74 U/L (ref 37–153)
BUN: 14 mg/dL (ref 7–25)
CO2: 28 mmol/L (ref 20–32)
Calcium: 9.3 mg/dL (ref 8.6–10.4)
Chloride: 106 mmol/L (ref 98–110)
Creat: 0.64 mg/dL (ref 0.60–1.00)
Globulin: 2.8 g/dL (calc) (ref 1.9–3.7)
Glucose, Bld: 90 mg/dL (ref 65–99)
Potassium: 4.4 mmol/L (ref 3.5–5.3)
Sodium: 141 mmol/L (ref 135–146)
Total Bilirubin: 1 mg/dL (ref 0.2–1.2)
Total Protein: 6.7 g/dL (ref 6.1–8.1)
eGFR: 94 mL/min/{1.73_m2} (ref >=60)

## 2022-12-31 LAB — CBC WITH DIFFERENTIAL/PLATELET
Eosinophils Relative: 1.7 %
Hemoglobin: 13.2 g/dL (ref 11.7–15.5)
Lymphs Abs: 1818 cells/uL (ref 850–3900)
MCV: 92 fL (ref 80.0–100.0)
MPV: 11.9 fL (ref 7.5–12.5)
Monocytes Relative: 6.4 %
Platelets: 212 10*3/uL (ref 140–400)
RBC: 4.5 10*6/uL (ref 3.80–5.10)

## 2022-12-31 LAB — LIPID PANEL W/REFLEX DIRECT LDL
Cholesterol: 240 mg/dL — ABNORMAL HIGH (ref <200)
HDL: 71 mg/dL (ref >=50)
LDL Cholesterol (Calc): 144 mg/dL (calc) — ABNORMAL HIGH
Non-HDL Cholesterol (Calc): 169 mg/dL (calc) — ABNORMAL HIGH (ref <130)
Total CHOL/HDL Ratio: 3.4 (calc) (ref <5.0)
Triglycerides: 127 mg/dL (ref <150)

## 2022-12-31 LAB — TSH: TSH: 1.13 mIU/L (ref 0.40–4.50)

## 2022-12-31 NOTE — Progress Notes (Signed)
Sandralee,   Thyroid looks GREAT.  Kidney, liver, glucose looks good.  HDL, good cholesterol, looks great.  LDL, bad cholesterol, not to goal.   Your 10 year CV risk is elevated. Anything above 7.5 percent we suggest a daily statin to lower cholesterol and help prevent CV events. Would you consider starting? I would also start ASA 81mg  daily.   Marland Kitchen.The 10-year ASCVD risk score (Arnett DK, et al., 2019) is: 17.8%   Values used to calculate the score:     Age: 72 years     Sex: Female     Is Non-Hispanic African American: Yes     Diabetic: No     Tobacco smoker: No     Systolic Blood Pressure: 135 mmHg     Is BP treated: Yes     HDL Cholesterol: 71 mg/dL     Total Cholesterol: 240 mg/dL

## 2023-01-02 MED ORDER — ATORVASTATIN CALCIUM 40 MG PO TABS: 40.0000 mg | ORAL_TABLET | Freq: Every day | ORAL | 3 refills | Status: DC

## 2023-01-02 NOTE — Addendum Note (Signed)
Addended by: Jomarie Longs on: 01/02/2023 09:40 AM   Modules accepted: Orders

## 2023-01-28 ENCOUNTER — Ambulatory Visit: Payer: Medicare HMO

## 2023-01-28 DIAGNOSIS — Z1231 Encounter for screening mammogram for malignant neoplasm of breast: Secondary | ICD-10-CM | POA: Diagnosis not present

## 2023-01-28 DIAGNOSIS — I10 Essential (primary) hypertension: Secondary | ICD-10-CM | POA: Diagnosis not present

## 2023-01-30 NOTE — Progress Notes (Signed)
Hi Harriett Sine, I am covering for [place] while she is out.  Your Mammogram  shows some asymmetry in both breasts. The imaging dept will contact you soon to schedule further imaging.

## 2023-02-02 ENCOUNTER — Other Ambulatory Visit: Payer: Self-pay | Admitting: Physician Assistant

## 2023-02-02 DIAGNOSIS — R928 Other abnormal and inconclusive findings on diagnostic imaging of breast: Secondary | ICD-10-CM

## 2023-02-05 ENCOUNTER — Ambulatory Visit: Payer: Medicare HMO

## 2023-02-05 ENCOUNTER — Ambulatory Visit
Admission: RE | Admit: 2023-02-05 | Discharge: 2023-02-05 | Disposition: A | Discharge status: Home / Self Care | Payer: Medicare HMO | Source: Ambulatory Visit | Attending: Physician Assistant | Admitting: Physician Assistant

## 2023-02-05 DIAGNOSIS — R928 Other abnormal and inconclusive findings on diagnostic imaging of breast: Secondary | ICD-10-CM | POA: Diagnosis not present

## 2023-02-05 DIAGNOSIS — N6321 Unspecified lump in the left breast, upper outer quadrant: Secondary | ICD-10-CM | POA: Diagnosis not present

## 2023-02-06 ENCOUNTER — Other Ambulatory Visit: Payer: Self-pay | Admitting: Physician Assistant

## 2023-02-06 DIAGNOSIS — N6321 Unspecified lump in the left breast, upper outer quadrant: Secondary | ICD-10-CM

## 2023-02-11 ENCOUNTER — Encounter: Payer: Self-pay | Admitting: Family Medicine

## 2023-02-11 ENCOUNTER — Emergency Department (HOSPITAL_BASED_OUTPATIENT_CLINIC_OR_DEPARTMENT_OTHER): Payer: Medicare HMO

## 2023-02-11 ENCOUNTER — Ambulatory Visit
Admission: EM | Admit: 2023-02-11 | Discharge: 2023-02-11 | Disposition: A | Discharge status: Home / Self Care | Payer: Medicare HMO

## 2023-02-11 ENCOUNTER — Encounter (HOSPITAL_BASED_OUTPATIENT_CLINIC_OR_DEPARTMENT_OTHER): Payer: Self-pay | Admitting: Emergency Medicine

## 2023-02-11 ENCOUNTER — Emergency Department (HOSPITAL_BASED_OUTPATIENT_CLINIC_OR_DEPARTMENT_OTHER)
Admission: EM | Admit: 2023-02-11 | Discharge: 2023-02-11 | Disposition: A | Discharge status: Home / Self Care | Payer: Medicare HMO | Attending: Emergency Medicine | Admitting: Emergency Medicine

## 2023-02-11 ENCOUNTER — Other Ambulatory Visit: Payer: Self-pay

## 2023-02-11 DIAGNOSIS — D72829 Elevated white blood cell count, unspecified: Secondary | ICD-10-CM | POA: Diagnosis not present

## 2023-02-11 DIAGNOSIS — M7989 Other specified soft tissue disorders: Secondary | ICD-10-CM | POA: Diagnosis not present

## 2023-02-11 DIAGNOSIS — K7689 Other specified diseases of liver: Secondary | ICD-10-CM | POA: Diagnosis not present

## 2023-02-11 DIAGNOSIS — K8080 Other cholelithiasis without obstruction: Secondary | ICD-10-CM

## 2023-02-11 DIAGNOSIS — I10 Essential (primary) hypertension: Secondary | ICD-10-CM | POA: Insufficient documentation

## 2023-02-11 DIAGNOSIS — R1011 Right upper quadrant pain: Secondary | ICD-10-CM | POA: Diagnosis not present

## 2023-02-11 DIAGNOSIS — K8020 Calculus of gallbladder without cholecystitis without obstruction: Secondary | ICD-10-CM | POA: Diagnosis not present

## 2023-02-11 DIAGNOSIS — K8050 Calculus of bile duct without cholangitis or cholecystitis without obstruction: Secondary | ICD-10-CM | POA: Diagnosis not present

## 2023-02-11 DIAGNOSIS — R1031 Right lower quadrant pain: Secondary | ICD-10-CM

## 2023-02-11 DIAGNOSIS — R109 Unspecified abdominal pain: Secondary | ICD-10-CM | POA: Diagnosis not present

## 2023-02-11 LAB — COMPREHENSIVE METABOLIC PANEL
ALT: 20 U/L (ref 0–44)
AST: 18 U/L (ref 15–41)
Albumin: 3.9 g/dL (ref 3.5–5.0)
Alkaline Phosphatase: 73 U/L (ref 38–126)
Anion gap: 10 (ref 5–15)
BUN: 20 mg/dL (ref 8–23)
CO2: 24 mmol/L (ref 22–32)
Calcium: 9 mg/dL (ref 8.9–10.3)
Chloride: 103 mmol/L (ref 98–111)
Creatinine, Ser: 0.73 mg/dL (ref 0.44–1.00)
GFR, Estimated: >60 mL/min (ref >60)
Glucose, Bld: 99 mg/dL (ref 70–99)
Potassium: 3.7 mmol/L (ref 3.5–5.1)
Sodium: 137 mmol/L (ref 135–145)
Total Bilirubin: 0.8 mg/dL (ref 0.3–1.2)
Total Protein: 7.6 g/dL (ref 6.5–8.1)

## 2023-02-11 LAB — CBC WITH DIFFERENTIAL/PLATELET
Abs Immature Granulocytes: 0.03 10*3/uL (ref 0.00–0.07)
Basophils Absolute: 0 10*3/uL (ref 0.0–0.1)
Basophils Relative: 0 %
Eosinophils Absolute: 0.1 10*3/uL (ref 0.0–0.5)
Eosinophils Relative: 1 %
HCT: 40.4 % (ref 36.0–46.0)
Hemoglobin: 13.1 g/dL (ref 12.0–15.0)
Immature Granulocytes: 0 %
Lymphocytes Relative: 20 %
Lymphs Abs: 2.1 10*3/uL (ref 0.7–4.0)
MCH: 29 pg (ref 26.0–34.0)
MCHC: 32.4 g/dL (ref 30.0–36.0)
MCV: 89.4 fL (ref 80.0–100.0)
Monocytes Absolute: 0.9 10*3/uL (ref 0.1–1.0)
Monocytes Relative: 9 %
Neutro Abs: 7.3 10*3/uL (ref 1.7–7.7)
Neutrophils Relative %: 70 %
Platelets: 196 10*3/uL (ref 150–400)
RBC: 4.52 MIL/uL (ref 3.87–5.11)
RDW: 13.4 % (ref 11.5–15.5)
WBC: 10.7 10*3/uL — ABNORMAL HIGH (ref 4.0–10.5)
nRBC: 0 % (ref 0.0–0.2)

## 2023-02-11 LAB — LIPASE, BLOOD: Lipase: 34 U/L (ref 11–51)

## 2023-02-11 LAB — URINALYSIS, W/ REFLEX TO CULTURE (INFECTION SUSPECTED)
Bilirubin Urine: NEGATIVE
Glucose, UA: NEGATIVE mg/dL
Hgb urine dipstick: NEGATIVE
Ketones, ur: NEGATIVE mg/dL
Leukocytes,Ua: NEGATIVE
Nitrite: NEGATIVE
Protein, ur: NEGATIVE mg/dL
Specific Gravity, Urine: 1.025 (ref 1.005–1.030)
pH: 5.5 (ref 5.0–8.0)

## 2023-02-11 MED ORDER — DICLOFENAC SODIUM 1 % EX GEL: 2.0000 g | Freq: Four times a day (QID) | CUTANEOUS | 0 refills | Status: DC | PRN

## 2023-02-11 MED ORDER — METHOCARBAMOL 500 MG PO TABS: 500.0000 mg | ORAL_TABLET | Freq: Three times a day (TID) | ORAL | 0 refills | Status: DC | PRN

## 2023-02-11 MED ORDER — IOHEXOL 300 MG/ML  SOLN
100.0000 mL | Freq: Once | INTRAMUSCULAR | Status: AC | PRN
  Administered 2023-02-11: 100 mL via INTRAVENOUS

## 2023-02-11 NOTE — ED Triage Notes (Signed)
Pt c/o RUQ vs RT ribs pain, esp with deep breath x 4d; also has BLE edema; sent here from [facility]

## 2023-02-11 NOTE — ED Provider Notes (Signed)
[facility]    CSN: [id number] Arrival date & time: 02/11/23  1718      History   Chief Complaint Chief Complaint  Patient presents with   Leg Swelling    HPI Erin Rowe is a 72 y.o. female.   HPI 72 year old female presents with right lower quadrant pain that started 1 week ago reports 9 of 10 in severity sharp.  Additionally patient reports bilateral lower extremity edema for the past 4 days. PMH significant for obesity, HTN, and GERD.  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   Concussion with loss of consciousness 12/31/2016   Hyperglycemia 08/28/2015   Advance care planning 07/01/2015   Medicare annual wellness visit, initial 07/01/2015   GERD (gastroesophageal reflux disease) 07/01/2015   Benign paroxysmal positional vertigo 07/01/2015    Past Surgical History:  Procedure Laterality Date   ABDOMINAL HYSTERECTOMY     COLONOSCOPY WITH PROPOFOL N/A 05/31/2015   Procedure: COLONOSCOPY WITH PROPOFOL;  Surgeon: Scot Jun, MD;  Location[facility] ENDOSCOPY;  Service: Endoscopy;  Laterality: N/A;    OB History   No obstetric history on file.      Home Medications    Prior to Admission medications   Medication Sig Start Date End Date Taking? Authorizing Provider  amLODipine (NORVASC) 5 MG tablet Take 1 tablet (5 mg total) by mouth daily. 12/29/22   Jomarie Longs, PA-C  aspirin 81 MG tablet Take 81 mg by mouth daily as needed for pain.    [provider]  atorvastatin (LIPITOR) 40 MG tablet Take 1 tablet (40 mg total) by mouth daily. 01/02/23   Tandy Gaw L, PA-C   naproxen sodium (ALEVE) 220 MG tablet Take 1-2 tablets (220-440 mg total) by mouth 2 (two) times daily with a meal. 04/30/17   Joaquim Nam, MD    Family History Family History  Problem Relation Age of Onset   Heart disease Mother    Hypertension Mother    Kidney disease Mother    Stroke Mother    Colon cancer Mother    Prostate cancer Father    Dementia Father    Breast cancer Paternal Aunt     Social History Social History   Tobacco Use   Smoking status: Never   Smokeless tobacco: Never  Substance Use Topics   Alcohol use: No   Drug use: No     Allergies   Bee venom and Penicillins   Review of Systems Review of Systems  Gastrointestinal:  Positive for abdominal pain.  All other systems reviewed and are negative.    Physical Exam Triage Vital Signs ED Triage Vitals  Encounter Vitals Group     BP      Systolic BP Percentile      Diastolic BP Percentile      Pulse      Resp      Temp      Temp src      SpO2      Weight      Height      Head  Circumference      Peak Flow      Pain Score      Pain Loc      Pain Education      Exclude from Growth Chart    No data found.  Updated Vital Signs BP 124/72 (BP Location: Left Arm)   Pulse 83   Temp (!) 97.3 F (36.3 C) (Oral)   Resp 16   SpO2 98%      Physical Exam Vitals and nursing note reviewed.  Constitutional:      General: She is not in acute distress.    Appearance: Normal appearance. She is obese. She is ill-appearing.  HENT:     Head: Normocephalic and atraumatic.     Mouth/Throat:     Mouth: Mucous membranes are moist.     Pharynx: Oropharynx is clear.  Eyes:     Extraocular Movements: Extraocular movements intact.     Conjunctiva/sclera: Conjunctivae normal.     Pupils: Pupils are equal, round, and reactive to light.  Cardiovascular:     Rate and Rhythm: Normal rate and regular rhythm.     Pulses: Normal pulses.     Heart sounds: Normal heart sounds. No murmur  heard. Pulmonary:     Effort: Pulmonary effort is normal.     Breath sounds: Normal breath sounds. No wheezing, rhonchi or rales.  Abdominal:     General: Abdomen is flat. Bowel sounds are absent. There is no abdominal bruit.     Palpations: Abdomen is rigid. There is no shifting dullness, fluid wave, hepatomegaly, splenomegaly, mass or pulsatile mass.     Tenderness: There is abdominal tenderness in the right lower quadrant. There is no right CVA tenderness, left CVA tenderness, guarding or rebound.     Hernia: No hernia is present.  Musculoskeletal:        General: Normal range of motion.     Cervical back: Normal range of motion and neck supple.  Skin:    General: Skin is warm and dry.  Neurological:     General: No focal deficit present.     Mental Status: She is alert and oriented to person, place, and time. Mental status is at baseline.  Psychiatric:        Mood and Affect: Mood normal.        Behavior: Behavior normal.        Thought Content: Thought content normal.      UC Treatments / Results  Labs (all labs ordered are listed, but only abnormal results are displayed) Labs Reviewed - No data to display  EKG   Radiology No results found.  Procedures Procedures (including critical care time)  Medications Ordered in UC Medications - No data to display  Initial Impression / Assessment and Plan / UC Course  I have reviewed the triage vital signs and the nursing notes.  Pertinent labs & imaging results that were available during my care of the patient were reviewed by me and considered in my medical decision making (see chart for details).     MDM: 1.  Right lower quadrant abdominal pain-advised patient to go to [facility] ED now for further evaluation of right lower quadrant abdominal pain and to rule out acute appendicitis.  Patient agreed and verbalized understanding of these instructions and this plan of care this evening.  Patient  discharged, hemodynamically stable.  Final Clinical Impressions(s) / UC Diagnoses   Final diagnoses:  RLQ abdominal pain  Discharge Instructions      Advised patient to go to [facility] ED now for further evaluation of right lower quadrant abdominal pain and to rule out acute appendicitis.     ED Prescriptions   None    PDMP not reviewed this encounter.   Trevor Iha, FNP 02/11/23 1925

## 2023-02-11 NOTE — ED Provider Notes (Signed)
Emergency Department Provider Note   I have reviewed the triage vital signs and the nursing notes.   HISTORY  Chief Complaint Leg Swelling and Abdominal Pain   HPI [name] is a 72 y.o. female past history of hypertension hypercholesterolemia presents to the emergency department with 4 days of right upper quadrant abdominal pain.  She has some associated bilateral lower extremity swelling, especially in the ankles.  No pain into the chest.  She is not feeling particularly short of breath.  She has sudden worsening pain with breathing in deeply or touching the right side of her abdomen.  No nausea or vomiting.  No change with eating.  No similar pain in the past. She began at [facility] and was referred here for further evaluation.   Past Medical History:  Diagnosis Date   Allergy    GERD (gastroesophageal reflux disease)    History of vertigo 12/29/2022   Hypertension    Vertigo     Review of Systems  Constitutional: No fever/chills Cardiovascular: Denies chest pain. Respiratory: Denies shortness of breath. Gastrointestinal: Positive RUQ abdominal pain.  No nausea, no vomiting.  No diarrhea.  No constipation. Genitourinary: Negative for dysuria. Musculoskeletal: Negative for back pain. Skin: Negative for rash. Neurological: Negative for headaches.   ____________________________________________   PHYSICAL EXAM:  VITAL SIGNS: ED Triage Vitals  Encounter Vitals Group     BP 02/11/23 1812 134/67     Pulse Rate 02/11/23 1812 87     Resp 02/11/23 1812 20     Temp 02/11/23 1812 98.8 F (37.1 C)     Temp src --      SpO2 02/11/23 1812 97 %     Weight 02/11/23 1813 219 lb (99.3 kg)     Height 02/11/23 1813 5' 6.5" (1.689 m)   Constitutional: Alert and oriented. Well appearing and in no acute distress. Eyes: Conjunctivae are normal. Head: Atraumatic. Nose: No congestion/rhinnorhea. Mouth/Throat: Mucous membranes are moist.  Neck: No stridor.   Cardiovascular: Normal  rate, regular rhythm. Good peripheral circulation. Grossly normal heart sounds.   Respiratory: Normal respiratory effort.  No retractions. Lungs CTAB. Gastrointestinal: Soft with focal tenderness in the RUQ with some voluntary guarding. No distention.  Musculoskeletal: No lower extremity tenderness nor edema. No gross deformities of extremities. Neurologic:  Normal speech and language. No gross focal neurologic deficits are appreciated.  Skin:  Skin is warm, dry and intact. No rash noted.  ____________________________________________   LABS (all labs ordered are listed, but only abnormal results are displayed)  Labs Reviewed  CBC WITH DIFFERENTIAL/PLATELET - Abnormal; Notable for the following components:      Result Value   WBC 10.7 (*)    All other components within normal limits  URINALYSIS, W/ REFLEX TO CULTURE (INFECTION SUSPECTED) - Abnormal; Notable for the following components:   Bacteria, UA RARE (*)    All other components within normal limits  COMPREHENSIVE METABOLIC PANEL  LIPASE, BLOOD   ____________________________________________  EKG   EKG Interpretation Date/Time:  Wednesday February 11 2023 18:22:35 EDT Ventricular Rate:  80 PR Interval:  165 QRS Duration:  75 QT Interval:  366 QTC Calculation: 423 R Axis:   71  Text Interpretation: Sinus rhythm Consider right atrial enlargement Confirmed by Alona Bene [phone]) on 02/11/2023 6:28:14 PM        ____________________________________________  RADIOLOGY  CT ABDOMEN PELVIS W CONTRAST  Result Date: 02/11/2023 CLINICAL DATA:  Bilateral flank pain EXAM: CT ABDOMEN AND PELVIS WITH CONTRAST TECHNIQUE:  Multidetector CT imaging of the abdomen and pelvis was performed using the standard protocol following bolus administration of intravenous contrast. RADIATION DOSE REDUCTION: This exam was performed according to the departmental dose-optimization program which includes automated exposure control, adjustment of the mA  and/or kV according to patient size and/or use of iterative reconstruction technique. CONTRAST:  OMNIPAQUE IOHEXOL 300 MG/ML  SOLN COMPARISON:  Ultrasound 02/11/2023 FINDINGS: Lower chest: Lung bases are clear. Hepatobiliary: Multiple hepatic cysts and subcentimeter hypodensities too small to further characterize, no imaging follow-up recommended. No calcified gallstones. No intra hepatic biliary dilatation. Upper normal common bile duct. Pancreas: Unremarkable. No pancreatic ductal dilatation or surrounding inflammatory changes. Spleen: Normal in size without focal abnormality. Adrenals/Urinary Tract: Negative for adrenal mass. Scattered right adrenal calcifications potentially due to remote insult. Kidneys show no hydronephrosis. The bladder is normal Stomach/Bowel: Stomach is within normal limits. Appendix appears normal. No evidence of bowel wall thickening, distention, or inflammatory changes. Vascular/Lymphatic: Mild aortic atherosclerosis. No aneurysm. No suspicious lymph nodes Reproductive: Status post hysterectomy. No adnexal masses. Other: Negative for pelvic effusion or free air Musculoskeletal: No acute or suspicious osseous abnormality IMPRESSION: 1. No CT evidence for acute intra-abdominal or pelvic abnormality. 2. Multiple hepatic cysts. 3. Aortic atherosclerosis. Aortic Atherosclerosis (ICD10-I70.0). Electronically Signed   By: Jasmine Pang M.D.   On: 02/11/2023 20:16   US Abdomen Limited RUQ (LIVER/GB)  Result Date: 02/11/2023 CLINICAL DATA:  Right upper quadrant pain EXAM: ULTRASOUND ABDOMEN LIMITED RIGHT UPPER QUADRANT COMPARISON:  None Available. FINDINGS: Gallbladder: Multiple gallstones. Negative sonographic Murphy. Normal wall thickness Common bile duct: Diameter: 7.2 mm Liver: Echogenicity within normal limits. Hepatic cysts measuring up to 6.5 cm, no imaging follow-up is recommended. Portal vein is patent on color Doppler imaging with normal direction of blood flow towards the  liver. Other: None. IMPRESSION: 1. Cholelithiasis without sonographic evidence for acute cholecystitis. 2. Upper normal to slightly dilated common bile duct. Correlate with LFTs. Electronically Signed   By: Jasmine Pang M.D.   On: 02/11/2023 19:27    ____________________________________________   PROCEDURES  Procedure(s) performed:   Procedures  None  ____________________________________________   INITIAL IMPRESSION / ASSESSMENT AND PLAN / ED COURSE  Pertinent labs & imaging results that were available during my care of the patient were reviewed by me and considered in my medical decision making (see chart for details).   This patient is Presenting for Evaluation of abdominal pain, which does require a range of treatment options, and is a complaint that involves a high risk of morbidity and mortality.  The Differential Diagnoses includes but is not exclusive to acute cholecystitis, intrathoracic causes for epigastric abdominal pain, gastritis, duodenitis, pancreatitis, small bowel or large bowel obstruction, abdominal aortic aneurysm, hernia, gastritis, etc.   Critical Interventions-    Medications  iohexol (OMNIPAQUE) 300 MG/ML solution 100 mL (100 mLs Intravenous Contrast Given 02/11/23 1942)    Reassessment after intervention:  symptoms improved.   I decided to review pertinent External Data, and in summary patient was seen in the ED immediately prior to ED visit. No labs or imaging for my immediate review.   Clinical Laboratory Tests Ordered, included UA without infection.  Lipase and LFTs normal.  Bilirubin normal.  Mild leukocytosis to 10.7 without anemia.  Radiologic Tests Ordered, included RUQ [place] and CT abdomen/pelvis. I independently interpreted the images and agree with radiology interpretation.   Cardiac Monitor Tracing which shows NSR.    Social Determinants of Health Risk patient is a non-smoker.  Medical Decision Making: Summary:  Patient presents emergency  department for evaluation of right-sided abdominal pain.  She does notice that pain is worse with deep breathing but on my exam she is focally tender in the abdomen.  My overall suspicion for PE or other process in the chest is very low.  Plan for right upper quadrant ultrasound along with screening lab work and reassess.  Reevaluation with update and discussion with patient. Labs and imaging discussed and reassuring. No clear etiology to explain patient's pain. Pain remain reproducible on exam. ? MSK discomfort vs early zoster. Discussed being aware of any developing rash in this area. Discussed strict ED return precautions. Possible symptomatic cholelithiasis but history is not typical and exam is reassuring. Gave contact information for general surgery for outpatient follow up should symptoms worsen. Advised close PCP follow up.   Patient's presentation is most consistent with acute presentation with potential threat to life or bodily function.   Disposition: discharge  ____________________________________________  FINAL CLINICAL IMPRESSION(S) / ED DIAGNOSES  Final diagnoses:  Right upper quadrant abdominal pain  Biliary calculus of other site without obstruction     NEW OUTPATIENT MEDICATIONS STARTED DURING THIS VISIT:  Discharge Medication List as of 02/11/2023  8:40 PM     START taking these medications   Details  diclofenac Sodium (VOLTAREN) 1 % GEL Apply 2 g topically 4 (four) times daily as needed., Starting Wed 02/11/2023, Normal    methocarbamol (ROBAXIN) 500 MG tablet Take 1 tablet (500 mg total) by mouth every 8 (eight) hours as needed for muscle spasms., Starting Wed 02/11/2023, Normal        Note:  This document was prepared using Dragon voice recognition software and may include unintentional dictation errors.  Alona Bene, MD, [facility] Emergency Medicine    Calianna Kim, Arlyss Repress, MD 02/11/23 2204

## 2023-02-11 NOTE — Discharge Instructions (Signed)
Advised patient to go to [facility] ED now for further evaluation of right lower quadrant abdominal pain and to rule out acute appendicitis.

## 2023-02-11 NOTE — ED Triage Notes (Signed)
C/o RLQ pain starting 1 week ago and bilateral LE edema starting 4 days ago. No hx of same. 9/10 in severity, sharp, taking tylenol without relief.

## 2023-02-11 NOTE — ED Notes (Signed)
[place] at bedside with pt.

## 2023-02-11 NOTE — ED Notes (Signed)
Patient is being discharged from the Urgent Care and sent to the Emergency Department via POV . Per Trevor Iha NP, patient is in need of higher level of care due to abdominal pain and lower extremity edema. Patient is aware and verbalizes understanding of plan of care.  Vitals:   02/11/23 1742  BP: 124/72  Pulse: 83  Resp: 16  Temp: (!) 97.3 F (36.3 C)  SpO2: 98%

## 2023-02-11 NOTE — ED Notes (Signed)
Pt endorses bilateral flank pain and lower back pain with ROM and activity in the bed. Describes it as sharp pains.

## 2023-02-11 NOTE — Discharge Instructions (Signed)
You were seen in the emergency room today with right-sided abdominal pain.  Your work appears reassuring.  You do have some stones in your gallbladder but no inflammation.  This can cause pain from time to time but often people have worsening pain with eating or vomiting.  I have listed the name of a general surgeon on this form if the symptoms develop, or your pain worsens you could follow-up with them.  I would like for you to follow closely with your primary care doctor in the coming week to review your symptoms and ED visit.  They can advise on further testing or referrals as needed.  Please return to the emergency department if you develop any chest pain, shortness of breath, sudden worsening symptoms in your abdomen.   I have called in a muscle relaxing medicine which will cause drowsiness and can cause lightheadedness.  Please take only if you are at home and planning to be in bed. No driving on this medicine.

## 2023-02-13 ENCOUNTER — Other Ambulatory Visit: Payer: Medicare HMO

## 2023-02-16 ENCOUNTER — Ambulatory Visit
Admission: RE | Admit: 2023-02-16 | Discharge: 2023-02-16 | Disposition: A | Discharge status: Home / Self Care | Payer: Medicare HMO | Source: Ambulatory Visit | Attending: Physician Assistant | Admitting: Physician Assistant

## 2023-02-16 DIAGNOSIS — R928 Other abnormal and inconclusive findings on diagnostic imaging of breast: Secondary | ICD-10-CM | POA: Diagnosis not present

## 2023-02-16 DIAGNOSIS — N6321 Unspecified lump in the left breast, upper outer quadrant: Secondary | ICD-10-CM

## 2023-02-16 DIAGNOSIS — N6320 Unspecified lump in the left breast, unspecified quadrant: Secondary | ICD-10-CM | POA: Diagnosis not present

## 2023-02-16 HISTORY — PX: BREAST BIOPSY: SHX20

## 2023-02-17 ENCOUNTER — Encounter: Payer: Self-pay | Admitting: Physician Assistant

## 2023-02-17 ENCOUNTER — Ambulatory Visit: Payer: Medicare HMO | Admitting: Physician Assistant

## 2023-02-17 VITALS — BP 116/62 | HR 83 | Ht 66.5 in | Wt 219.2 lb

## 2023-02-17 DIAGNOSIS — R600 Localized edema: Secondary | ICD-10-CM | POA: Diagnosis not present

## 2023-02-17 DIAGNOSIS — I10 Essential (primary) hypertension: Secondary | ICD-10-CM

## 2023-02-17 DIAGNOSIS — T887XXA Unspecified adverse effect of drug or medicament, initial encounter: Secondary | ICD-10-CM

## 2023-02-17 DIAGNOSIS — K8020 Calculus of gallbladder without cholecystitis without obstruction: Secondary | ICD-10-CM

## 2023-02-17 DIAGNOSIS — K838 Other specified diseases of biliary tract: Secondary | ICD-10-CM | POA: Diagnosis not present

## 2023-02-17 DIAGNOSIS — R1011 Right upper quadrant pain: Secondary | ICD-10-CM

## 2023-02-17 MED ORDER — OLMESARTAN MEDOXOMIL-HCTZ 20-12.5 MG PO TABS
1.0000 | ORAL_TABLET | Freq: Every day | ORAL | 0 refills | Status: DC
Start: 2023-02-17 — End: 2023-03-10

## 2023-02-17 NOTE — Patient Instructions (Addendum)
Stop norvasc.  Start olmesartan/hydrochlorothiazide daily in the morning.  Recheck BP in 3 weeks.  Will refer to general surgery for gallstones consult.

## 2023-02-17 NOTE — Progress Notes (Signed)
Established Patient Office Visit  Subjective   Patient ID: Erin Rowe, female    DOB: 10-Mar-1951  Age: 72 y.o. MRN: [id number]  Chief Complaint  Patient presents with   Medical Management of Chronic Issues    Pt has been having complaints of back pain, feet swelling, and pain on right side when she breathes in. States she went to the ED and was told she had gallstones.    HPI Pt is a 72 yo female with HTN who presents to the clinic to follow up after ED visit for RUQ pain. She was found to have gallstones without any gallbladder inflammation. Her pain is improved but was 10/10 when went to ED. She continues to have some right upper quadrant soreness and bilateral ankle swelling.  WBC 10.7 no other lab abnormalities. Denies any fever, chills, nausea, vomiting, diarrhea, constipation.   She does feel like swelling started after starting norvasc. No SOB or CP.   .. 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   Concussion with loss of consciousness 12/31/2016   Rib fracture 05/01/2017   Primary hypertension 12/29/2022   History of vertigo 12/29/2022   History of colon polyps 12/29/2022   Elevated LDL cholesterol level 12/31/2022   Bilateral lower extremity edema 02/17/2023   Medication side effect 02/17/2023   Common bile duct dilatation 02/17/2023   Multiple gallstones 02/17/2023   Right upper quadrant pain 02/17/2023   Resolved Ambulatory Problems    Diagnosis Date Noted   No Resolved Ambulatory Problems   Past Medical History:  Diagnosis Date   Allergy    Hypertension    Vertigo      ROS See HPI.    Objective:     BP 116/62 (BP Location: Right Arm, Patient Position: Sitting, Cuff Size: Large)   Pulse 83    Ht 5' 6.5" (1.689 m)   Wt 219 lb 4 oz (99.5 kg)   SpO2 97%   BMI 34.86 kg/m  BP Readings from Last 3 Encounters:  02/17/23 116/62  02/11/23 128/78  02/11/23 124/72   Wt Readings from Last 3 Encounters:  02/17/23 219 lb 4 oz (99.5 kg)  02/11/23 219 lb (99.3 kg)  12/29/22 219 lb (99.3 kg)      Physical Exam Constitutional:      Appearance: Normal appearance. She is obese.  HENT:     Head: Normocephalic.  Cardiovascular:     Rate and Rhythm: Normal rate and regular rhythm.     Pulses: Normal pulses.     Heart sounds: Normal heart sounds.  Pulmonary:     Effort: Pulmonary effort is normal.     Breath sounds: Normal breath sounds.  Abdominal:     General: Bowel sounds are normal. There is no distension.     Palpations: Abdomen is soft. There is no mass.     Tenderness: There is abdominal tenderness. There is no right CVA tenderness, left CVA tenderness, guarding or rebound.     Hernia: No hernia is present.     Comments: Right upper quadrant tenderness to palpation.   Musculoskeletal:     Cervical back: Normal range of motion and neck supple.     Right lower leg: Edema present.     Left lower leg: Edema  present.     Comments: Bilateral non pitting ankle swelling.   Neurological:     General: No focal deficit present.     Mental Status: She is alert and oriented to person, place, and time.  Psychiatric:        Mood and Affect: Mood normal.       The 10-year ASCVD risk score (Arnett DK, et al., 2019) is: 14.9%    Assessment & Plan:  Marland KitchenMarland KitchenBrownie was seen today for medical management of chronic issues.  Diagnoses and all orders for this visit:  Multiple gallstones -     Ambulatory referral to General Surgery  Bilateral lower extremity edema  Primary hypertension -     olmesartan-hydrochlorothiazide (BENICAR HCT) 20-12.5 MG tablet; Take 1 tablet by mouth daily.  Medication side effect  Common bile duct dilatation -     Ambulatory referral to General  Surgery  Right upper quadrant pain   BP looks great but suspect ankle swelling is coming from norvasc Stop norvasc Start benicar hydrochlorothiazide in morning Recheck in 3 weeks  Referral made for general surgery to consult for cholecystectomy.  Discussed diet to avoid exacerbation of gallstones.  If worsening call office.     Return in about 3 weeks (around 03/10/2023), or if symptoms worsen or fail to improve, for bP recheck.    Tandy Gaw, PA-C

## 2023-03-10 ENCOUNTER — Ambulatory Visit (INDEPENDENT_AMBULATORY_CARE_PROVIDER_SITE_OTHER): Payer: Medicare HMO | Admitting: Physician Assistant

## 2023-03-10 ENCOUNTER — Encounter: Payer: Self-pay | Admitting: Physician Assistant

## 2023-03-10 VITALS — BP 113/70 | HR 82 | Ht 66.0 in | Wt 216.0 lb

## 2023-03-10 DIAGNOSIS — Z23 Encounter for immunization: Secondary | ICD-10-CM

## 2023-03-10 DIAGNOSIS — K8020 Calculus of gallbladder without cholecystitis without obstruction: Secondary | ICD-10-CM

## 2023-03-10 DIAGNOSIS — I10 Essential (primary) hypertension: Secondary | ICD-10-CM

## 2023-03-10 MED ORDER — OLMESARTAN MEDOXOMIL-HCTZ 20-12.5 MG PO TABS
1.0000 | ORAL_TABLET | Freq: Every day | ORAL | 1 refills | Status: DC
Start: 2023-03-10 — End: 2023-09-25

## 2023-03-10 MED ORDER — URSODIOL NICU ORAL SYRINGE 60 MG/ML
8.0000 mg/kg/d | Freq: Two times a day (BID) | ORAL | 5 refills | Status: DC
Start: 2023-03-10 — End: 2023-11-23

## 2023-03-10 NOTE — Patient Instructions (Signed)
Cholelithiasis  Cholelithiasis happens when gallstones form in the gallbladder. The gallbladder stores bile. Bile is a fluid that helps digest fats. Bile can harden and form into gallstones. If they cause a blockage, they can cause pain (gallbladder attack). What are the causes? This condition may be caused by: Too much bilirubin in the bile. This happens if you have sickle cell anemia. Too much of a fat-like substance (cholesterol) in your bile. Not enough bile salts in your bile. These salts help the body absorb and digest fats. The gallbladder not emptying fully or often enough. This is common in pregnant women. What increases the risk? The following factors may make you more likely to develop this condition: Being older than age 3. Eating a lot of fried foods, fat, and refined carbs (refined carbohydrates). Being female. Being pregnant many times. Using medicines with female hormones in them for a long time. Losing weight fast. Having gallstones in your family. Having health problems, such as diabetes, obesity, Crohn's disease, or liver disease. What are the signs or symptoms? Often, there may be gallstones but no symptoms. These gallstones are called silent gallstones. If a gallstone causes a blockage, you may get sudden pain. The pain: Can be in the upper right part of your belly (abdomen). Normally comes at night or after you eat. Can last an hour or more. Can spread to your right shoulder, back, or chest. Can feel like discomfort, burning, or fullness in the upper part of your belly (indigestion). If the blockage lasts more than a few hours, you can get an infection or swelling. You may: Vomit or feel like you may vomit (nauseous). Feel bloated. Have belly pain for 5 hours or more. Feel tender in your belly, often in the upper right part and under your ribs. Have a fever or chills. Have skin or the white parts of your eyes turn yellow (jaundice). Have dark pee (urine) or  pale poop (stool). How is this treated? Treatment for this condition depends on how bad you feel. If you have symptoms, you may need: Home care, if symptoms are not very bad. Do not eat for 12-24 hours. Drink only water and clear liquids. After 1 or 2 days, start to eat simple or clear foods. Try broth and crackers. You may need medicines for pain or stomach upset or both. If you have an infection, you will need antibiotics. A hospital stay, if you have very bad pain or a very bad infection. Surgery to remove your gallbladder. You may need this if: Gallstones keep coming back. You have very bad symptoms. Medicines to break up gallstones. Medicines may be used for 6-12 months. A procedure to find and take out gallstones or to break up gallstones. Follow these instructions at home: Medicines Take over-the-counter and prescription medicines only as told by your doctor. If you were prescribed antibiotics, take them as told by your doctor. Do not stop taking them even if you start to feel better. Ask your doctor if you should avoid driving or using machines while you are taking your medicine. Eating and drinking Drink enough fluid to keep your pee pale yellow. Drink water or clear fluids. This is important when you have pain. Eat healthy foods. Choose: Fewer fatty foods, such as fried foods. Fewer refined carbs. Avoid breads and grains that are highly processed, such as white bread and white rice. Choose whole grains, such as whole-wheat bread and brown rice. More fiber. Almonds, fresh fruit, and beans are healthy sources. General  instructions Keep a healthy weight. Keep all follow-up visits. You may need to see a specialist or a Careers adviser. Where to find more information [employer] of Diabetes and Digestive and Kidney Diseases: [email or web address] Contact a doctor if: You have sudden pain in the upper right part of your belly. Pain might spread to your right shoulder, back, or chest. Your  pain lasts more than 2 hours. You have been diagnosed with gallstones that have no symptoms and you get: Belly pain. Discomfort, burning, or fullness in the upper part of your abdomen. You keep feeling like you may vomit. You have dark pee or pale poop. Get help right away if: You have pain in your abdomen, that: Lasts more than 5 hours. Keeps getting worse. You have a fever or chills. You can't stop vomiting. Your skin or the white parts of your eyes turn yellow. This information is not intended to replace advice given to you by your health care provider. Make sure you discuss any questions you have with your health care provider. Document Revised: 04/28/2022 Document Reviewed: 04/28/2022 Elsevier Patient Education  2024 [employer].

## 2023-03-10 NOTE — Progress Notes (Signed)
Established Patient Office Visit  Subjective   Patient ID: Erin Rowe, female    DOB: 1950/10/14  Age: 72 y.o. MRN: [id number]  Chief Complaint  Patient presents with   Medical Management of Chronic Issues    gallstones    HPI Pt is a 72 yo obese female with gallstones, HTN, GERD who presents to the clinic for follow up on blood pressure.   She was taken off norvasc due to SE of ankle edema. She is doing well on benicar/hydrochlorothiazide. No CP, palpitaitons, headaches or vision changes. She is checking her BP at home and most of the time good but does spike at times in the 150s/80s.   She has had no abdominal pain since July ED visit where she was found to have gallstones. She is taking OTC supplements to help dissolve stones with Cheyene pepper, greens and morning olferia. She has appt with surgeon tomorrow. She currently has no diet limitations. She denies any constipation or diarrhea.  .. 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   Concussion with loss of consciousness 12/31/2016   Rib fracture 05/01/2017   Primary hypertension 12/29/2022   History of vertigo 12/29/2022   History of colon polyps 12/29/2022   Elevated LDL cholesterol level 12/31/2022   Bilateral lower extremity edema 02/17/2023   Medication side effect 02/17/2023   Common bile duct dilatation 02/17/2023   Multiple gallstones 02/17/2023   Right upper quadrant pain 02/17/2023   Resolved Ambulatory Problems    Diagnosis Date Noted   No Resolved Ambulatory Problems   Past Medical History:  Diagnosis Date   Allergy    Hypertension    Vertigo      ROS See HPI.    Objective:     BP 113/70   Pulse 82   Ht 5\' 6"  (1.676 m)   Wt 216 lb (98  kg)   SpO2 99%   BMI 34.86 kg/m  BP Readings from Last 3 Encounters:  03/10/23 113/70  02/17/23 116/62  02/11/23 128/78   Wt Readings from Last 3 Encounters:  03/10/23 216 lb (98 kg)  02/17/23 219 lb 4 oz (99.5 kg)  02/11/23 219 lb (99.3 kg)      Physical Exam Constitutional:      Appearance: Normal appearance. She is obese.  HENT:     Head: Normocephalic.  Cardiovascular:     Rate and Rhythm: Normal rate and regular rhythm.     Heart sounds: Normal heart sounds.  Pulmonary:     Effort: Pulmonary effort is normal.  Abdominal:     General: Bowel sounds are normal. There is no distension.     Palpations: Abdomen is soft. There is no mass.     Tenderness: There is no abdominal tenderness. There is no right CVA tenderness, left CVA tenderness, guarding or rebound.     Hernia: No hernia is present.  Musculoskeletal:     Right lower leg: No edema.     Left lower leg: No edema.  Neurological:     General: No focal deficit present.     Mental Status: She is alert and oriented to person, place, and time.  Psychiatric:        Mood and Affect: Mood normal.  The 10-year ASCVD risk score (Arnett DK, et al., 2019) is: 14.3%    Assessment & Plan:  Erin Rowe KitchenMarland KitchenMinetta was seen today for medical management of chronic issues.  Diagnoses and all orders for this visit:  Multiple gallstones -     ursodiol (ACTIGALL) 60 mg/mL; Take 6.5 mLs (390 mg total) by mouth 2 (two) times daily.  Primary hypertension -     olmesartan-hydrochlorothiazide (BENICAR HCT) 20-12.5 MG tablet; Take 1 tablet by mouth daily.  Need for influenza vaccination -     Flu vaccine HIGH DOSE PF (Fluzone High dose)   Reminder to schedule medicare wellness Consider shingles and covid vaccine Flu shot given today  Discussed gallstones Appt with surgeon tomorrow  No problems since July consider ursodiol to dissolve stones U/S in 6 months Follow up as needed  BP to goal Continue benicar/HcT  Follow up in 6  months  Return in about 6 months (around 09/10/2023), or if symptoms worsen or fail to improve, for please schedule medicare wellness and 6 month follow up.    Tandy Gaw, PA-C

## 2023-03-11 DIAGNOSIS — K8020 Calculus of gallbladder without cholecystitis without obstruction: Secondary | ICD-10-CM | POA: Diagnosis not present

## 2023-05-29 DIAGNOSIS — Z800 Family history of malignant neoplasm of digestive organs: Secondary | ICD-10-CM | POA: Diagnosis not present

## 2023-05-29 DIAGNOSIS — K635 Polyp of colon: Secondary | ICD-10-CM | POA: Diagnosis not present

## 2023-05-29 DIAGNOSIS — K829 Disease of gallbladder, unspecified: Secondary | ICD-10-CM | POA: Diagnosis not present

## 2023-05-29 DIAGNOSIS — K5909 Other constipation: Secondary | ICD-10-CM | POA: Diagnosis not present

## 2023-06-01 ENCOUNTER — Ambulatory Visit: Payer: Medicare HMO | Admitting: Family Medicine

## 2023-06-01 DIAGNOSIS — Z0000 Encounter for general adult medical examination without abnormal findings: Secondary | ICD-10-CM

## 2023-06-01 NOTE — Progress Notes (Signed)
MEDICARE ANNUAL WELLNESS VISIT  06/01/2023  Telephone Visit Disclaimer This Medicare AWV was conducted by telephone due to national recommendations for restrictions regarding the COVID-19 Pandemic (e.g. social distancing).  I verified, using two identifiers, that I am speaking with Erin Rowe or their authorized healthcare agent. I discussed the limitations, risks, security, and privacy concerns of performing an evaluation and management service by telephone and the potential availability of an in-person appointment in the future. The patient expressed understanding and agreed to proceed.  Location of Patient: Home Location of Provider (nurse):  In the office.   Subjective:    Erin Rowe is a 72 y.o. female patient of Caleen Essex, Lonna Cobb, PA-C who had a [employer] Visit today via telephone. Erin Rowe is Retired and lives with their spouse. Erin Rowe has 2 children. Erin Rowe reports that Erin Rowe is socially active and does interact with friends/family regularly. Erin Rowe is moderately physically active and enjoys crocheting.  Patient Care Team: Nolene Ebbs as PCP - General (Family Medicine)     06/01/2023    1:11 PM 02/11/2023    6:14 PM 12/16/2021    6:09 AM 04/14/2017    9:27 PM 04/10/2017    9:13 AM 12/05/2016    8:35 PM 05/09/2015   10:04 AM  Advanced Directives  Does Patient Have a Medical Advance Directive? No No No No Yes No;Yes No  Type of Dispensing optician of [place];Living will   Does patient want to make changes to medical advance directive?      No - Patient declined   Copy of Healthcare Power of Attorney in Chart?      No - copy requested   Would patient like information on creating a medical advance directive? No - Patient declined      No - patient declined information    Hospital Utilization Over the Past 12 Months: # of hospitalizations or ER visits: 0 # of surgeries: 0  Review of Systems    Patient reports that her overall health is  unchanged compared to last year.  History obtained from chart review and the patient  Patient Reported Readings (BP, Pulse, CBG, Weight, etc) none Per patient no change in vitals since last visit, unable to obtain new vitals due to telehealth visit  Pain Assessment Pain : No/denies pain     Current Medications & Allergies (verified) Allergies as of 06/01/2023       Reactions   Bee Venom Anaphylaxis   Penicillins Rash        Medication List        Accurate as of June 01, 2023  1:24 PM. If you have any questions, ask your nurse or doctor.          STOP taking these medications    amLODipine 5 MG tablet Commonly known as: NORVASC   diclofenac Sodium 1 % Gel Commonly known as: Voltaren   methocarbamol 500 MG tablet Commonly known as: ROBAXIN       TAKE these medications    aspirin 81 MG tablet Take 81 mg by mouth daily as needed for pain.   atorvastatin 40 MG tablet Commonly known as: LIPITOR Take 1 tablet (40 mg total) by mouth daily.   MORINGA PO Take by mouth.   naproxen sodium 220 MG tablet Commonly known as: Aleve Take 1-2 tablets (220-440 mg total) by mouth 2 (two) times daily with a meal.   olmesartan-hydrochlorothiazide 20-12.5 MG tablet  Commonly known as: Benicar HCT Take 1 tablet by mouth daily.   ursodiol 60 mg/mL Commonly known as: ACTIGALL Take 6.5 mLs (390 mg total) by mouth 2 (two) times daily.        History (reviewed): Past Medical History:  Diagnosis Date   Allergy    GERD (gastroesophageal reflux disease)    History of vertigo 12/29/2022   Hypertension    Vertigo    Past Surgical History:  Procedure Laterality Date   ABDOMINAL HYSTERECTOMY     BREAST BIOPSY Left 02/16/2023   MM LT BREAST BX W LOC DEV 1ST LESION IMAGE BX SPEC STEREO GUIDE 02/16/2023 [facility]   COLONOSCOPY WITH PROPOFOL N/A 05/31/2015   Procedure: COLONOSCOPY WITH PROPOFOL;  Surgeon: Scot Jun, MD;  Location: [facility] ENDOSCOPY;  Service:  Endoscopy;  Laterality: N/A;   Family History  Problem Relation Age of Onset   Heart disease Mother    Hypertension Mother    Kidney disease Mother    Stroke Mother    Colon cancer Mother    Prostate cancer Father    Breast cancer Paternal Aunt    Social History   Socioeconomic History   Marital status: Married    Spouse name: Erin Rowe   Number of children: 2   Years of education: 12   Highest education level: 12th grade  Occupational History   Occupation: Retired.  Tobacco Use   Smoking status: Never   Smokeless tobacco: Never  Vaping Use   Vaping status: Never Used  Substance and Sexual Activity   Alcohol use: No   Drug use: No   Sexual activity: Not on file  Other Topics Concern   Not on file  Social History Narrative   Lives with her husband. Erin Rowe has two daughters. 2 daughters (1 in [place] and one in [place]). Erin Rowe enjoys crocheting.   Social Determinants of Health   Financial Resource Strain: Low Risk  (06/01/2023)   Overall Financial Resource Strain (CARDIA)    Difficulty of Paying Living Expenses: Not hard at all  Food Insecurity: No Food Insecurity (06/01/2023)   Hunger Vital Sign    Worried About Running Out of Food in the Last Year: Never true    Ran Out of Food in the Last Year: Never true  Transportation Needs: No Transportation Needs (06/01/2023)   PRAPARE - Administrator, Civil Service (Medical): No    Lack of Transportation (Non-Medical): No  Physical Activity: Inactive (06/01/2023)   Exercise Vital Sign    Days of Exercise per Week: 0 days    Minutes of Exercise per Session: 0 min  Stress: No Stress Concern Present (06/01/2023)   [employer] of Occupational Health - Occupational Stress Questionnaire    Feeling of Stress : Not at all  Social Connections: Moderately Integrated (06/01/2023)   Social Connection and Isolation Panel [NHANES]    Frequency of Communication with Friends and Family: More than three times a week     Frequency of Social Gatherings with Friends and Family: More than three times a week    Attends Religious Services: More than 4 times per year    Active Member of [employer] or Organizations: No    Attends Banker Meetings: Never    Marital Status: Married    Activities of Daily Living    06/01/2023    1:16 PM  In your present state of health, do you have any difficulty performing the following activities:  Hearing? 0  Vision?  0  Difficulty concentrating or making decisions? 0  Walking or climbing stairs? 0  Dressing or bathing? 0  Doing errands, shopping? 0  Preparing Food and eating ? N  Using the Toilet? N  In the past six months, have you accidently leaked urine? N  Do you have problems with loss of bowel control? N  Managing your Medications? N  Managing your Finances? N  Housekeeping or managing your Housekeeping? N    Patient Education/ Literacy How often do you need to have someone help you when you read instructions, pamphlets, or other written materials from your doctor or pharmacy?: 1 - Never What is the last grade level you completed in school?: 12th grade  Exercise    Diet Patient reports consuming 3 meals a day and 2 snack(s) a day Patient reports that her primary diet is: Regular Patient reports that Erin Rowe does have regular access to food.   Depression Screen    06/01/2023    1:14 PM 03/10/2023    2:24 PM 02/17/2023    2:18 PM 12/29/2022    2:35 PM  PHQ 2/9 Scores  PHQ - 2 Score 0 0 0 0     Fall Risk    06/01/2023    1:14 PM 02/17/2023    2:18 PM 02/28/2019    5:31 PM  Fall Risk   Falls in the past year? 0 0 0  Comment   Emmi Telephone Survey: data to providers prior to load  Number falls in past yr: 0 0   Injury with Fall? 0 0   Risk for fall due to : No Fall Risks No Fall Risks   Follow up Falls evaluation completed Falls evaluation completed      Objective:  Erin Rowe seemed alert and oriented and Erin Rowe participated appropriately  during our telephone visit.  Blood Pressure Weight BMI  BP Readings from Last 3 Encounters:  03/10/23 113/70  02/17/23 116/62  02/11/23 128/78   Wt Readings from Last 3 Encounters:  03/10/23 216 lb (98 kg)  02/17/23 219 lb 4 oz (99.5 kg)  02/11/23 219 lb (99.3 kg)   BMI Readings from Last 1 Encounters:  03/10/23 34.86 kg/m    *Unable to obtain current vital signs, weight, and BMI due to telephone visit type  Hearing/Vision  Erin Rowe did not seem to have difficulty with hearing/understanding during the telephone conversation Reports that Erin Rowe has not had a formal eye exam by an eye care professional within the past year Reports that Erin Rowe has not had a formal hearing evaluation within the past year *Unable to fully assess hearing and vision during telephone visit type  Cognitive Function:    06/01/2023    1:18 PM  6CIT Screen  What Year? 0 points  What month? 0 points  What time? 0 points  Count back from 20 0 points  Months in reverse 0 points  Repeat phrase 4 points  Total Score 4 points   (Normal:0-7, Significant for Dysfunction: >8)  Normal Cognitive Function Screening: Yes   Immunization & Health Maintenance Record Immunization History  Administered Date(s) Administered   Influenza, High Dose Seasonal PF 03/10/2023   Influenza,inj,Quad PF,6+ Mos 06/29/2015   Moderna Covid-19 Vaccine Bivalent Booster 44yrs & up 12/31/2020   Pneumococcal Conjugate-13 08/27/2015   Pneumococcal Polysaccharide-23 11/18/2016   Tdap 12/05/2016    Health Maintenance  Topic Date Due   COVID-19 Vaccine (2 - 2023-24 season) 06/17/2023 (Originally 03/29/2023)   Zoster Vaccines- Shingrix (1 of 2)  09/01/2023 (Originally 01/31/2000)   Colonoscopy  12/29/2023 (Originally 05/30/2020)   Medicare Annual Wellness (AWV)  05/31/2024   MAMMOGRAM  02/04/2025   DTaP/Tdap/Td (2 - Td or Tdap) 12/06/2026   DEXA SCAN  12/30/2026   Pneumonia Vaccine 28+ Years old  Completed   INFLUENZA VACCINE  Completed    Hepatitis C Screening  Completed   HPV VACCINES  Aged Out       Assessment  This is a routine wellness examination for Erin Rowe.  Health Maintenance: Due or Overdue There are no preventive care reminders to display for this patient.   Erin Rowe does not need a referral for [employer] Assistance: Care Management:   no Social Work:    no Prescription Assistance:  no Nutrition/Diabetes Education:  no   Plan:  Personalized Goals  Goals Addressed               This Visit's Progress     Patient Stated (pt-stated)        Patient stated that Erin Rowe would like to work on weight loss.       Personalized Health Maintenance & Screening Recommendations  Shingles vaccine Colonoscopy - scheduled for 08/10/23. Dexa scan- discuss with PCP.    Lung Cancer Screening Recommended: no (Low Dose CT Chest recommended if Age 69-80 years, 20 pack-year currently smoking OR have quit w/in past 15 years) Hepatitis C Screening recommended: no HIV Screening recommended: no  Advanced Directives: Written information was not prepared per patient's request.  Referrals & Orders No orders of the defined types were placed in this encounter.   Follow-up Plan Follow-up with Jomarie Longs, PA-C as planned Schedule shingles vaccine at the pharmacy when you are ready.  Medicare wellness visit in one year.  Patient will access AVS on my chart.   I have personally reviewed and noted the following in the patient's chart:   Medical and social history Use of alcohol, tobacco or illicit drugs  Current medications and supplements Functional ability and status Nutritional status Physical activity Advanced directives List of other physicians Hospitalizations, surgeries, and ER visits in previous 12 months Vitals Screenings to include cognitive, depression, and falls Referrals and appointments  In addition, I have reviewed and discussed with Erin Rowe certain preventive protocols,  quality metrics, and best practice recommendations. A written personalized care plan for preventive services as well as general preventive health recommendations is available and can be mailed to the patient at her request.      Modesto Charon, RN BSN  06/01/2023

## 2023-06-01 NOTE — Patient Instructions (Addendum)
MEDICARE ANNUAL WELLNESS VISIT Health Maintenance Summary and Written Plan of Care  Erin Rowe ,  Thank you for allowing me to perform your Medicare Annual Wellness Visit and for your ongoing commitment to your health.   Health Maintenance & Immunization History Health Maintenance  Topic Date Due  . COVID-19 Vaccine (2 - 2023-24 season) 06/17/2023 (Originally 03/29/2023)  . Zoster Vaccines- Shingrix (1 of 2) 09/01/2023 (Originally 01/31/2000)  . Colonoscopy  12/29/2023 (Originally 05/30/2020)  . Medicare Annual Wellness (AWV)  05/31/2024  . MAMMOGRAM  02/04/2025  . DTaP/Tdap/Td (2 - Td or Tdap) 12/06/2026  . DEXA SCAN  12/30/2026  . Pneumonia Vaccine 48+ Years old  Completed  . INFLUENZA VACCINE  Completed  . Hepatitis C Screening  Completed  . HPV VACCINES  Aged Out   Immunization History  Administered Date(s) Administered  . Influenza, High Dose Seasonal PF 03/10/2023  . Influenza,inj,Quad PF,6+ Mos 06/29/2015  . Moderna Covid-19 Vaccine Bivalent Booster 62yrs & up 12/31/2020  . Pneumococcal Conjugate-13 08/27/2015  . Pneumococcal Polysaccharide-23 11/18/2016  . Tdap 12/05/2016    These are the patient goals that we discussed:  Goals Addressed              This Visit's Progress   .  Patient Stated (pt-stated)        Patient stated that she would like to work on weight loss.        This is a list of Health Maintenance Items that are overdue or due now: Shingles vaccine Colonoscopy - scheduled for 08/10/23. Dexa scan- discuss with PCP.     Orders/Referrals Placed Today: No orders of the defined types were placed in this encounter.  (Contact our referral department at [phone] if you have not spoken with someone about your referral appointment within the next 5 days)    Follow-up Plan Follow-up with Jomarie Longs, PA-C as planned Schedule shingles vaccine at the pharmacy when you are ready.  Medicare wellness visit in one year.  Patient will access AVS  on my chart.      Health Maintenance, Female Adopting a healthy lifestyle and getting preventive care are important in promoting health and wellness. Ask your health care provider about: The right schedule for you to have regular tests and exams. Things you can do on your own to prevent diseases and keep yourself healthy. What should I know about diet, weight, and exercise? Eat a healthy diet  Eat a diet that includes plenty of vegetables, fruits, low-fat dairy products, and lean protein. Do not eat a lot of foods that are high in solid fats, added sugars, or sodium. Maintain a healthy weight Body mass index (BMI) is used to identify weight problems. It estimates body fat based on height and weight. Your health care provider can help determine your BMI and help you achieve or maintain a healthy weight. Get regular exercise Get regular exercise. This is one of the most important things you can do for your health. Most adults should: Exercise for at least 150 minutes each week. The exercise should increase your heart rate and make you sweat (moderate-intensity exercise). Do strengthening exercises at least twice a week. This is in addition to the moderate-intensity exercise. Spend less time sitting. Even light physical activity can be beneficial. Watch cholesterol and blood lipids Have your blood tested for lipids and cholesterol at 73 years of age, then have this test every 5 years. Have your cholesterol levels checked more often if: Your lipid or cholesterol  levels are high. You are older than 73 years of age. You are at high risk for heart disease. What should I know about cancer screening? Depending on your health history and family history, you may need to have cancer screening at various ages. This may include screening for: Breast cancer. Cervical cancer. Colorectal cancer. Skin cancer. Lung cancer. What should I know about heart disease, diabetes, and high blood  pressure? Blood pressure and heart disease High blood pressure causes heart disease and increases the risk of stroke. This is more likely to develop in people who have high blood pressure readings or are overweight. Have your blood pressure checked: Every 3-5 years if you are 57-97 years of age. Every year if you are 53 years old or older. Diabetes Have regular diabetes screenings. This checks your fasting blood sugar level. Have the screening done: Once every three years after age 28 if you are at a normal weight and have a low risk for diabetes. More often and at a younger age if you are overweight or have a high risk for diabetes. What should I know about preventing infection? Hepatitis B If you have a higher risk for hepatitis B, you should be screened for this virus. Talk with your health care provider to find out if you are at risk for hepatitis B infection. Hepatitis C Testing is recommended for: Everyone born from [birth year] through 1966. Anyone with known risk factors for hepatitis C. Sexually transmitted infections (STIs) Get screened for STIs, including gonorrhea and chlamydia, if: You are sexually active and are younger than 73 years of age. You are older than 73 years of age and your health care provider tells you that you are at risk for this type of infection. Your sexual activity has changed since you were last screened, and you are at increased risk for chlamydia or gonorrhea. Ask your health care provider if you are at risk. Ask your health care provider about whether you are at high risk for HIV. Your health care provider may recommend a prescription medicine to help prevent HIV infection. If you choose to take medicine to prevent HIV, you should first get tested for HIV. You should then be tested every 3 months for as long as you are taking the medicine. Pregnancy If you are about to stop having your period (premenopausal) and you may become pregnant, seek counseling before you  get pregnant. Take 400 to 800 micrograms (mcg) of folic acid every day if you become pregnant. Ask for birth control (contraception) if you want to prevent pregnancy. Osteoporosis and menopause Osteoporosis is a disease in which the bones lose minerals and strength with aging. This can result in bone fractures. If you are 49 years old or older, or if you are at risk for osteoporosis and fractures, ask your health care provider if you should: Be screened for bone loss. Take a calcium or vitamin D supplement to lower your risk of fractures. Be given hormone replacement therapy (HRT) to treat symptoms of menopause. Follow these instructions at home: Alcohol use Do not drink alcohol if: Your health care provider tells you not to drink. You are pregnant, may be pregnant, or are planning to become pregnant. If you drink alcohol: Limit how much you have to: 0-1 drink a day. Know how much alcohol is in your drink. In the [place] one drink equals one 12 oz bottle of beer (355 mL), one 5 oz glass of wine (148 mL), or one 1 oz glass of  hard liquor (44 mL). Lifestyle Do not use any products that contain nicotine or tobacco. These products include cigarettes, chewing tobacco, and vaping devices, such as e-cigarettes. If you need help quitting, ask your health care provider. Do not use street drugs. Do not share needles. Ask your health care provider for help if you need support or information about quitting drugs. General instructions Schedule regular health, dental, and eye exams. Stay current with your vaccines. Tell your health care provider if: You often feel depressed. You have ever been abused or do not feel safe at home. Summary Adopting a healthy lifestyle and getting preventive care are important in promoting health and wellness. Follow your health care provider's instructions about healthy diet, exercising, and getting tested or screened for diseases. Follow your health care provider's  instructions on monitoring your cholesterol and blood pressure. This information is not intended to replace advice given to you by your health care provider. Make sure you discuss any questions you have with your health care provider. Document Revised: 12/03/2020 Document Reviewed: 12/03/2020 Elsevier Patient Education  2024 [employer].

## 2023-06-29 ENCOUNTER — Other Ambulatory Visit: Payer: Self-pay | Admitting: Physician Assistant

## 2023-06-29 DIAGNOSIS — I10 Essential (primary) hypertension: Secondary | ICD-10-CM

## 2023-07-14 ENCOUNTER — Other Ambulatory Visit: Payer: Self-pay | Admitting: General Surgery

## 2023-07-14 DIAGNOSIS — K8020 Calculus of gallbladder without cholecystitis without obstruction: Secondary | ICD-10-CM | POA: Diagnosis not present

## 2023-07-16 LAB — SURGICAL PATHOLOGY

## 2023-09-11 ENCOUNTER — Ambulatory Visit: Payer: Medicare HMO | Admitting: Physician Assistant

## 2023-09-24 ENCOUNTER — Other Ambulatory Visit: Payer: Self-pay | Admitting: Physician Assistant

## 2023-09-24 DIAGNOSIS — I10 Essential (primary) hypertension: Secondary | ICD-10-CM

## 2023-11-23 ENCOUNTER — Ambulatory Visit (INDEPENDENT_AMBULATORY_CARE_PROVIDER_SITE_OTHER): Payer: Medicare HMO | Admitting: Physician Assistant

## 2023-11-23 ENCOUNTER — Encounter: Payer: Self-pay | Admitting: Physician Assistant

## 2023-11-23 ENCOUNTER — Encounter

## 2023-11-23 VITALS — BP 106/46 | HR 99 | Ht 66.0 in | Wt 219.0 lb

## 2023-11-23 DIAGNOSIS — Z6835 Body mass index (BMI) 35.0-35.9, adult: Secondary | ICD-10-CM | POA: Diagnosis not present

## 2023-11-23 DIAGNOSIS — E7800 Pure hypercholesterolemia, unspecified: Secondary | ICD-10-CM | POA: Diagnosis not present

## 2023-11-23 DIAGNOSIS — E66812 Obesity, class 2: Secondary | ICD-10-CM

## 2023-11-23 DIAGNOSIS — G8929 Other chronic pain: Secondary | ICD-10-CM | POA: Diagnosis not present

## 2023-11-23 DIAGNOSIS — I10 Essential (primary) hypertension: Secondary | ICD-10-CM

## 2023-11-23 DIAGNOSIS — M545 Low back pain, unspecified: Secondary | ICD-10-CM

## 2023-11-23 DIAGNOSIS — E6609 Other obesity due to excess calories: Secondary | ICD-10-CM

## 2023-11-23 MED ORDER — OLMESARTAN MEDOXOMIL-HCTZ 20-12.5 MG PO TABS
1.0000 | ORAL_TABLET | Freq: Every day | ORAL | 3 refills | Status: AC
Start: 2023-11-23 — End: ?

## 2023-11-23 NOTE — Progress Notes (Unsigned)
   Established Patient Office Visit  Subjective   Patient ID: Erin Rowe, female    DOB: 1951-05-17  Age: 73 y.o. MRN: [id number]  No chief complaint on file.   HPI Jan 8th 2024  {History (Optional):23778}  ROS    Objective:     There were no vitals taken for this visit. {Vitals History (Optional):23777}  Physical Exam   No results found for any visits on 11/23/23.  {Labs (Optional):23779}  The 10-year ASCVD risk score (Arnett DK, et al., 2019) is: 13.5%    Assessment & Plan:   Problem List Items Addressed This Visit   None   No follow-ups on file.    Erin Barley, PA-C

## 2023-11-23 NOTE — Patient Instructions (Addendum)
 Get xray today.  Consider icy hot patches on areas of pain Walk 150 minutes a week Slenderiiz drops for weight management Low Back Sprain or Strain Rehab Ask your health care provider which exercises are safe for you. Do exercises exactly as told by your health care provider and adjust them as directed. It is normal to feel mild stretching, pulling, tightness, or discomfort as you do these exercises. Stop right away if you feel sudden pain or your pain gets worse. Do not begin these exercises until told by your health care provider. Stretching and range-of-motion exercises These exercises warm up your muscles and joints and improve the movement and flexibility of your back. These exercises also help to relieve pain, numbness, and tingling. Lumbar rotation  Lie on your back on a firm bed or the floor with your knees bent. Straighten your arms out to your sides so each arm forms a 90-degree angle (right angle) with a side of your body. Slowly move (rotate) both of your knees to one side of your body until you feel a stretch in your lower back (lumbar). Try not to let your shoulders lift off the floor. Hold this position for __________ seconds. Tense your abdominal muscles and slowly move your knees back to the starting position. Repeat this exercise on the other side of your body. Repeat __________ times. Complete this exercise __________ times a day. Single knee to chest  Lie on your back on a firm bed or the floor with both legs straight. Bend one of your knees. Use your hands to move your knee up toward your chest until you feel a gentle stretch in your lower back and buttock. Hold your leg in this position by holding on to the front of your knee. Keep your other leg as straight as possible. Hold this position for __________ seconds. Slowly return to the starting position. Repeat with your other leg. Repeat __________ times. Complete this exercise __________ times a day. Prone extension  on elbows  Lie on your abdomen on a firm bed or the floor (prone position). Prop yourself up on your elbows. Use your arms to help lift your chest up until you feel a gentle stretch in your abdomen and your lower back. This will place some of your body weight on your elbows. If this is uncomfortable, try stacking pillows under your chest. Your hips should stay down, against the surface that you are lying on. Keep your hip and back muscles relaxed. Hold this position for __________ seconds. Slowly relax your upper body and return to the starting position. Repeat __________ times. Complete this exercise __________ times a day. Strengthening exercises These exercises build strength and endurance in your back. Endurance is the ability to use your muscles for a long time, even after they get tired. Pelvic tilt This exercise strengthens the muscles that lie deep in the abdomen. Lie on your back on a firm bed or the floor with your legs extended. Bend your knees so they are pointing toward the ceiling and your feet are flat on the floor. Tighten your lower abdominal muscles to press your lower back against the floor. This motion will tilt your pelvis so your tailbone points up toward the ceiling instead of pointing to your feet or the floor. To help with this exercise, you may place a small towel under your lower back and try to push your back into the towel. Hold this position for __________ seconds. Let your muscles relax completely before you repeat  this exercise. Repeat __________ times. Complete this exercise __________ times a day. Alternating arm and leg raises  Get on your hands and knees on a firm surface. If you are on a hard floor, you may want to use padding, such as an exercise mat, to cushion your knees. Line up your arms and legs. Your hands should be directly below your shoulders, and your knees should be directly below your hips. Lift your left leg behind you. At the same time,  raise your right arm and straighten it in front of you. Do not lift your leg higher than your hip. Do not lift your arm higher than your shoulder. Keep your abdominal and back muscles tight. Keep your hips facing the ground. Do not arch your back. Keep your balance carefully, and do not hold your breath. Hold this position for __________ seconds. Slowly return to the starting position. Repeat with your right leg and your left arm. Repeat __________ times. Complete this exercise __________ times a day. Abdominal set with straight leg raise  Lie on your back on a firm bed or the floor. Bend one of your knees and keep your other leg straight. Tense your abdominal muscles and lift your straight leg up, 4-6 inches (10-15 cm) off the ground. Keep your abdominal muscles tight and hold this position for __________ seconds. Do not hold your breath. Do not arch your back. Keep it flat against the ground. Keep your abdominal muscles tense as you slowly lower your leg back to the starting position. Repeat with your other leg. Repeat __________ times. Complete this exercise __________ times a day. Single leg lower with bent knees Lie on your back on a firm bed or the floor. Tense your abdominal muscles and lift your feet off the floor, one foot at a time, so your knees and hips are bent in 90-degree angles (right angles). Your knees should be over your hips and your lower legs should be parallel to the floor. Keeping your abdominal muscles tense and your knee bent, slowly lower one of your legs so your toe touches the ground. Lift your leg back up to return to the starting position. Do not hold your breath. Do not let your back arch. Keep your back flat against the ground. Repeat with your other leg. Repeat __________ times. Complete this exercise __________ times a day. Posture and body mechanics Good posture and healthy body mechanics can help to relieve stress in your body's tissues and  joints. Body mechanics refers to the movements and positions of your body while you do your daily activities. Posture is part of body mechanics. Good posture means: Your spine is in its natural S-curve position (neutral). Your shoulders are pulled back slightly. Your head is not tipped forward (neutral). Follow these guidelines to improve your posture and body mechanics in your everyday activities. Standing  When standing, keep your spine neutral and your feet about hip-width apart. Keep a slight bend in your knees. Your ears, shoulders, and hips should line up. When you do a task in which you stand in one place for a long time, place one foot up on a stable object that is 2-4 inches (5-10 cm) high, such as a footstool. This helps keep your spine neutral. Sitting  When sitting, keep your spine neutral and keep your feet flat on the floor. Use a footrest, if necessary, and keep your thighs parallel to the floor. Avoid rounding your shoulders, and avoid tilting your head forward. When working at  a desk or a computer, keep your desk at a height where your hands are slightly lower than your elbows. Slide your chair under your desk so you are close enough to maintain good posture. When working at a computer, place your monitor at a height where you are looking straight ahead and you do not have to tilt your head forward or downward to look at the screen. Resting When lying down and resting, avoid positions that are most painful for you. If you have pain with activities such as sitting, bending, stooping, or squatting, lie in a position in which your body does not bend very much. For example, avoid curling up on your side with your arms and knees near your chest (fetal position). If you have pain with activities such as standing for a long time or reaching with your arms, lie with your spine in a neutral position and bend your knees slightly. Try the following positions: Lying on your side with a pillow  between your knees. Lying on your back with a pillow under your knees. Lifting  When lifting objects, keep your feet at least shoulder-width apart and tighten your abdominal muscles. Bend your knees and hips and keep your spine neutral. It is important to lift using the strength of your legs, not your back. Do not lock your knees straight out. Always ask for help to lift heavy or awkward objects. This information is not intended to replace advice given to you by your health care provider. Make sure you discuss any questions you have with your health care provider. Document Revised: 11/17/2022 Document Reviewed: 10/01/2020 Elsevier Patient Education  2024 [employer].

## 2023-11-24 ENCOUNTER — Encounter: Payer: Self-pay | Admitting: Physician Assistant

## 2023-11-24 DIAGNOSIS — M545 Low back pain, unspecified: Secondary | ICD-10-CM | POA: Insufficient documentation

## 2023-11-24 DIAGNOSIS — E6609 Other obesity due to excess calories: Secondary | ICD-10-CM | POA: Insufficient documentation

## 2023-11-24 DIAGNOSIS — G8929 Other chronic pain: Secondary | ICD-10-CM | POA: Insufficient documentation

## 2023-11-26 DIAGNOSIS — I10 Essential (primary) hypertension: Secondary | ICD-10-CM | POA: Diagnosis not present

## 2023-11-26 DIAGNOSIS — E7800 Pure hypercholesterolemia, unspecified: Secondary | ICD-10-CM | POA: Diagnosis not present

## 2023-11-27 ENCOUNTER — Encounter: Payer: Self-pay | Admitting: Physician Assistant

## 2023-11-27 LAB — CMP14+EGFR
ALT: 43 IU/L — ABNORMAL HIGH (ref 0–32)
AST: 34 IU/L (ref 0–40)
Albumin: 4.1 g/dL (ref 3.8–4.8)
Alkaline Phosphatase: 83 IU/L (ref 44–121)
BUN/Creatinine Ratio: 23 (ref 12–28)
BUN: 19 mg/dL (ref 8–27)
Bilirubin Total: 0.8 mg/dL (ref 0.0–1.2)
CO2: 24 mmol/L (ref 20–29)
Calcium: 9.4 mg/dL (ref 8.7–10.3)
Chloride: 102 mmol/L (ref 96–106)
Creatinine, Ser: 0.82 mg/dL (ref 0.57–1.00)
Globulin, Total: 2.7 g/dL (ref 1.5–4.5)
Glucose: 94 mg/dL (ref 70–99)
Potassium: 4.1 mmol/L (ref 3.5–5.2)
Sodium: 140 mmol/L (ref 134–144)
Total Protein: 6.8 g/dL (ref 6.0–8.5)
eGFR: 75 mL/min/{1.73_m2} (ref >59)

## 2023-11-27 LAB — LIPID PANEL
Chol/HDL Ratio: 2.6 ratio (ref 0.0–4.4)
Cholesterol, Total: 145 mg/dL (ref 100–199)
HDL: 55 mg/dL (ref >39)
LDL Chol Calc (NIH): 74 mg/dL (ref 0–99)
Triglycerides: 87 mg/dL (ref 0–149)
VLDL Cholesterol Cal: 16 mg/dL (ref 5–40)

## 2023-11-27 NOTE — Progress Notes (Signed)
 Jasman,   Kidney, glucose look good.  ALT, one liver enzyme, a little elevated. Stop any tylenol  and no alcohol and recheck in 2 weeks.  Cholesterol looks GREAT.

## 2023-12-14 ENCOUNTER — Telehealth: Payer: Self-pay

## 2023-12-14 NOTE — Telephone Encounter (Signed)
 Copied from CRM [id number]. Topic: Clinical - Request for Lab/Test Order >> Dec 14, 2023  2:32 PM Kevelyn M wrote: Reason for CRM: Patient stated that she was to get labs done in two week per Provider [facility] but forgot to call. Patient wants to know if she needs to take lab orders to [facility]. Please advise patient # [id number]

## 2023-12-15 ENCOUNTER — Other Ambulatory Visit: Payer: Self-pay

## 2023-12-15 DIAGNOSIS — R7401 Elevation of levels of liver transaminase levels: Secondary | ICD-10-CM

## 2023-12-15 NOTE — Telephone Encounter (Signed)
 Attempted call to patient. Left a detailed voice mail message on patient home # ( allowed on DPR )

## 2023-12-29 ENCOUNTER — Other Ambulatory Visit: Payer: Self-pay | Admitting: Physician Assistant

## 2024-01-01 ENCOUNTER — Ambulatory Visit

## 2024-01-01 DIAGNOSIS — Z800 Family history of malignant neoplasm of digestive organs: Secondary | ICD-10-CM | POA: Diagnosis not present

## 2024-01-01 DIAGNOSIS — K621 Rectal polyp: Secondary | ICD-10-CM | POA: Diagnosis not present

## 2024-01-01 DIAGNOSIS — Z1211 Encounter for screening for malignant neoplasm of colon: Secondary | ICD-10-CM | POA: Diagnosis not present

## 2024-01-18 DIAGNOSIS — R7401 Elevation of levels of liver transaminase levels: Secondary | ICD-10-CM | POA: Diagnosis not present

## 2024-01-19 ENCOUNTER — Ambulatory Visit: Payer: Self-pay | Admitting: Physician Assistant

## 2024-01-19 LAB — CMP14+EGFR
ALT: 37 IU/L — ABNORMAL HIGH (ref 0–32)
AST: 38 IU/L (ref 0–40)
Albumin: 3.9 g/dL (ref 3.8–4.8)
Alkaline Phosphatase: 83 IU/L (ref 44–121)
BUN/Creatinine Ratio: 22 (ref 12–28)
BUN: 16 mg/dL (ref 8–27)
Bilirubin Total: 1 mg/dL (ref 0.0–1.2)
CO2: 23 mmol/L (ref 20–29)
Calcium: 9.1 mg/dL (ref 8.7–10.3)
Chloride: 103 mmol/L (ref 96–106)
Creatinine, Ser: 0.74 mg/dL (ref 0.57–1.00)
Globulin, Total: 2.4 g/dL (ref 1.5–4.5)
Glucose: 92 mg/dL (ref 70–99)
Potassium: 3.9 mmol/L (ref 3.5–5.2)
Sodium: 141 mmol/L (ref 134–144)
Total Protein: 6.3 g/dL (ref 6.0–8.5)
eGFR: 85 mL/min/{1.73_m2} (ref >59)

## 2024-01-19 NOTE — Progress Notes (Signed)
 ALT is trending down.

## 2024-02-08 ENCOUNTER — Telehealth: Payer: Self-pay

## 2024-02-08 DIAGNOSIS — Z1231 Encounter for screening mammogram for malignant neoplasm of breast: Secondary | ICD-10-CM

## 2024-02-08 NOTE — Telephone Encounter (Signed)
 Copied from CRM [id number]. Topic: Appointments - Scheduling Inquiry for Clinic >> Feb 08, 2024  1:18 PM Erin Rowe wrote: Reason for CRM: Patient is calling because it is tome for her annual mammogram, could you assist with that? Patients callback number is [phone].

## 2024-02-12 ENCOUNTER — Encounter: Payer: Self-pay | Admitting: Advanced Practice Midwife

## 2024-04-18 ENCOUNTER — Other Ambulatory Visit: Payer: Self-pay | Admitting: Physician Assistant

## 2024-06-02 ENCOUNTER — Ambulatory Visit

## 2024-06-02 VITALS — BP 118/56 | HR 78 | Ht 67.0 in | Wt 217.0 lb

## 2024-06-02 DIAGNOSIS — Z0000 Encounter for general adult medical examination without abnormal findings: Secondary | ICD-10-CM | POA: Diagnosis not present

## 2024-06-02 NOTE — Patient Instructions (Signed)
 Erin Rowe,  Thank you for taking the time for your Medicare Wellness Visit. I appreciate your continued commitment to your health goals. Please review the care plan we discussed, and feel free to reach out if I can assist you further.  Please note that Annual Wellness Visits do not include a physical exam. Some assessments may be limited, especially if the visit was conducted virtually. If needed, we may recommend an in-person follow-up with your provider.  Ongoing Care Seeing your primary care provider every 3 to 6 months helps us  monitor your health and provide consistent, personalized care.   Referrals If a referral was made during today's visit and you haven't received any updates within two weeks, please contact the referred provider directly to check on the status.  Recommended Screenings:  Health Maintenance  Topic Date Due   Medicare Annual Wellness Visit  05/31/2024   COVID-19 Vaccine (2 - 2025-26 season) 06/18/2024*   Zoster (Shingles) Vaccine (1 of 2) 09/02/2024*   Flu Shot  10/25/2024*   Breast Cancer Screening  02/04/2025   DTaP/Tdap/Td vaccine (2 - Td or Tdap) 12/06/2026   DEXA scan (bone density measurement)  12/30/2026   Colon Cancer Screening  01/07/2029   Pneumococcal Vaccine for age over 81  Completed   Hepatitis C Screening  Completed   Meningitis B Vaccine  Aged Out  *Topic was postponed. The date shown is not the original due date.       06/02/2024   10:45 AM  Advanced Directives  Does Patient Have a Medical Advance Directive? No  Would patient like information on creating a medical advance directive? No - Patient declined    Vision: Annual vision screenings are recommended for early detection of glaucoma, cataracts, and diabetic retinopathy. These exams can also reveal signs of chronic conditions such as diabetes and high blood pressure.  Dental: Annual dental screenings help detect early signs of oral cancer, gum disease, and other conditions linked to  overall health, including heart disease and diabetes.

## 2024-06-02 NOTE — Progress Notes (Signed)
 Subjective:   Erin Rowe is a 73 y.o. female who presents for a Medicare Annual Wellness Visit.  Allergies (verified) Bee venom and Penicillins   History: Past Medical History:  Diagnosis Date   Allergy    GERD (gastroesophageal reflux disease)    History of vertigo 12/29/2022   Hypertension    Vertigo    Past Surgical History:  Procedure Laterality Date   ABDOMINAL HYSTERECTOMY     BREAST BIOPSY Left 02/16/2023   MM LT BREAST BX W LOC DEV 1ST LESION IMAGE BX SPEC STEREO GUIDE 02/16/2023 [facility] M[facility]MO[facility]   COLONOSCOPY WITH PROPOFOL  N/A 05/31/2015   Procedure: COLONOSCOPY WITH PROPOFOL ;  Surgeon: Lamar ONEIDA Holmes, MD;  Location: [facility] END[facility]COPY;  Service: Endoscopy;  Laterality: N/A;   Family History  Problem Relation Age of Onset   Heart disease Mother    Hypertension Mother    Kidney disease Mother    Stroke Mother    Colon cancer Mother    Prostate cancer Father    Breast cancer Paternal Aunt    Social History   Occupational History   Occupation: Retired.  Tobacco Use   Smoking status: Never   Smokeless tobacco: Never  Vaping Use   Vaping status: Never Used  Substance and Sexual Activity   Alcohol use: No   Drug use: No   Sexual activity: Not on file   Tobacco Counseling Counseling given: Not Answered  SDOH Screenings   Food Insecurity: No Food Insecurity (06/02/2024)  Housing: Low Risk  (06/02/2024)  Transportation Needs: No Transportation Needs (06/02/2024)  Utilities: Not At Risk (06/02/2024)  Alcohol Screen: Low Risk  (06/01/2023)  Depression (PHQ2-9): Low Risk  (06/02/2024)  Financial Resource Strain: Low Risk  (05/30/2024)  Physical Activity: Sufficiently Active (06/02/2024)  Recent Concern: Physical Activity - Inactive (05/30/2024)  Social Connections: Moderately Integrated (06/02/2024)  Stress: No Stress Concern Present (06/02/2024)  Tobacco Use: Low Risk  (11/24/2023)  Health Literacy: Adequate Health Literacy (06/02/2024)   Depression Screen     06/02/2024   10:51 AM 06/01/2023    1:14 PM 03/10/2023    2:24 PM 02/17/2023    2:18 PM 12/29/2022    2:35 PM  PHQ 2/9 Scores  PHQ - 2 Score 0 0 0 0 0     Goals Addressed             This Visit's Progress    Patient Stated       Patient states she would like to lose weight.        Visit info / Clinical Intake: Medicare Wellness Visit Type:: Subsequent Annual Wellness Visit Medicare Wellness Visit Mode:: In-person (required for WTM) Interpreter Needed?: No Pre-visit prep was completed: yes AWV questionnaire completed by patient prior to visit?: no Living arrangements:: lives with spouse/significant other Patient's Overall Health Status Rating: good Typical amount of pain: none Does pain affect daily life?: no Are you currently prescribed opioids?: no  Dietary Habits and Nutritional Risks How many meals a day?: 2 Eats fruit and vegetables daily?: (!) no Most meals are obtained by: preparing own meals Diabetic:: no  Functional Status Activities of Daily Living (to include ambulation/medication): Independent Ambulation: Independent Medication Administration: Independent Home Management: Independent Manage your own finances?: yes Primary transportation is: driving Concerns about vision?: no *vision screening is required for WTM* Concerns about hearing?: no  Fall Screening Falls in the past year?: 0 Number of falls in past year: 0 Was there an injury with Fall?: 0 Fall Risk Category  Calculator: 0 Patient Fall Risk Level: Low Fall Risk  Fall Risk Patient at Risk for Falls Due to: No Fall Risks Fall risk Follow up: Falls evaluation completed  Home and Transportation Safety: All rugs have non-skid backing?: N/A, no rugs All stairs or steps have railings?: yes Grab bars in the bathtub or shower?: (!) no Have non-skid surface in bathtub or shower?: (!) no Good home lighting?: yes Regular seat belt use?: yes Hospital stays in the last year:: no  Cognitive  Assessment Difficulty concentrating, remembering, or making decisions? : no Will 6CIT or Mini Cog be Completed: yes What year is it?: 0 points What month is it?: 0 points Give patient an address phrase to remember (5 components): [address] [place], [place] [address] About what time is it?: 0 points Count backwards from 20 to 1: 0 points Say the months of the year in reverse: 0 points Repeat the address phrase from earlier: 0 points 6 CIT Score: 0 points  Advance Directives (For Healthcare) Does Patient Have a Medical Advance Directive?: No Would patient like information on creating a medical advance directive?: No - Patient declined  Reviewed/Updated  Reviewed/Updated: Medical History; Medications; Allergies; Care Teams; Patient Goals        Objective:    Today's Vitals   06/02/24 1039  BP: (!) 118/56  Pulse: 78  SpO2: 98%  Weight: 217 lb (98.4 kg)  Height: 5' 7 (1.702 m)   Body mass index is 33.99 kg/m.  Current Medications (verified) Outpatient Encounter Medications as of 06/02/2024  Medication Sig   aspirin 81 MG tablet Take 81 mg by mouth daily as needed for pain.   atorvastatin  (LIPITOR) 40 MG tablet Take 1 tablet by mouth once daily   Moringa Oleifera (MORINGA PO) Take by mouth.   olmesartan -hydrochlorothiazide (BENICAR  HCT) 20-12.5 MG tablet Take 1 tablet by mouth daily.   [DISCONTINUED] naproxen  sodium (ALEVE ) 220 MG tablet Take 1-2 tablets (220-440 mg total) by mouth 2 (two) times daily with a meal.   No facility-administered encounter medications on file as of 06/02/2024.   Hearing/Vision screen No results found. Immunizations and Health Maintenance Health Maintenance  Topic Date Due   COVID-19 Vaccine (2 - 2025-26 season) 06/18/2024 (Originally 03/28/2024)   Zoster Vaccines- Shingrix (1 of 2) 09/02/2024 (Originally 01/31/2000)   Influenza Vaccine  10/25/2024 (Originally 02/26/2024)   Mammogram  02/04/2025   Medicare Annual Wellness (AWV)  06/02/2025    DTaP/Tdap/Td (2 - Td or Tdap) 12/06/2026   DEXA SCAN  12/30/2026   Colonoscopy  01/07/2029   Pneumococcal Vaccine: 50+ Years  Completed   Hepatitis C Screening  Completed   Meningococcal B Vaccine  Aged Out        Assessment/Plan:  This is a routine wellness examination for [place].  Patient Care Team: Breeback, Jade L, PA-C as PCP - General (Family Medicine)  I have personally reviewed and noted the following in the patient's chart:   Medical and social history Use of alcohol, tobacco or illicit drugs  Current medications and supplements including opioid prescriptions. Functional ability and status Nutritional status Physical activity Advanced directives List of other physicians Hospitalizations, surgeries, and ER visits in previous 12 months Vitals Screenings to include cognitive, depression, and falls Referrals and appointments  No orders of the defined types were placed in this encounter.  In addition, I have reviewed and discussed with patient certain preventive protocols, quality metrics, and best practice recommendations. A written personalized care plan for preventive services as well as  general preventive health recommendations were provided to patient.   Bonny Jon Mayor, CMA   06/02/2024   Return in 1 year (on 06/02/2025).  After Visit Summary: (In Person-Printed) AVS printed and given to the patient  Nurse Notes:   Erin Rowe is a 73 y.o. female patient of Antoniette, Vermell CROME, PA-C who had a [employer] Visit today via telephone. Erin Rowe is Retired and lives with their spouse. She has 2 children. She reports that she is socially active and does interact with friends/family regularly. She is moderately physically active and enjoys crocheting.

## 2024-06-09 ENCOUNTER — Telehealth: Payer: Self-pay

## 2024-06-09 ENCOUNTER — Other Ambulatory Visit: Payer: Self-pay | Admitting: Physician Assistant

## 2024-06-09 DIAGNOSIS — N6489 Other specified disorders of breast: Secondary | ICD-10-CM

## 2024-06-09 DIAGNOSIS — R928 Other abnormal and inconclusive findings on diagnostic imaging of breast: Secondary | ICD-10-CM

## 2024-06-09 NOTE — Telephone Encounter (Signed)
 Per the imaging department. She needs a bilateral diagnostic mammogram. She would like to go to [place].   Pended orders.

## 2024-06-10 DIAGNOSIS — N6489 Other specified disorders of breast: Secondary | ICD-10-CM | POA: Insufficient documentation

## 2024-06-10 DIAGNOSIS — R928 Other abnormal and inconclusive findings on diagnostic imaging of breast: Secondary | ICD-10-CM | POA: Insufficient documentation

## 2024-06-10 NOTE — Telephone Encounter (Signed)
 Patient advised.

## 2024-06-20 ENCOUNTER — Ambulatory Visit
Admission: RE | Admit: 2024-06-20 | Discharge: 2024-06-20 | Disposition: A | Discharge status: Home / Self Care | Source: Ambulatory Visit | Attending: Physician Assistant | Admitting: Physician Assistant

## 2024-06-20 ENCOUNTER — Other Ambulatory Visit: Payer: Self-pay | Admitting: Physician Assistant

## 2024-06-20 DIAGNOSIS — R928 Other abnormal and inconclusive findings on diagnostic imaging of breast: Secondary | ICD-10-CM

## 2024-06-20 DIAGNOSIS — N6489 Other specified disorders of breast: Secondary | ICD-10-CM | POA: Diagnosis not present

## 2024-06-20 DIAGNOSIS — R921 Mammographic calcification found on diagnostic imaging of breast: Secondary | ICD-10-CM | POA: Diagnosis not present

## 2024-06-20 DIAGNOSIS — N6310 Unspecified lump in the right breast, unspecified quadrant: Secondary | ICD-10-CM | POA: Diagnosis not present

## 2024-06-20 DIAGNOSIS — R92333 Mammographic heterogeneous density, bilateral breasts: Secondary | ICD-10-CM | POA: Diagnosis not present

## 2024-06-21 ENCOUNTER — Other Ambulatory Visit: Payer: Self-pay | Admitting: Physician Assistant

## 2024-06-21 DIAGNOSIS — R928 Other abnormal and inconclusive findings on diagnostic imaging of breast: Secondary | ICD-10-CM

## 2024-07-01 ENCOUNTER — Inpatient Hospital Stay: Admission: RE | Admit: 2024-07-01

## 2024-07-01 ENCOUNTER — Other Ambulatory Visit

## 2024-07-06 ENCOUNTER — Ambulatory Visit
Admission: RE | Admit: 2024-07-06 | Discharge: 2024-07-06 | Disposition: A | Discharge status: Home / Self Care | Source: Ambulatory Visit | Attending: Physician Assistant | Admitting: Physician Assistant

## 2024-07-06 ENCOUNTER — Inpatient Hospital Stay
Admission: RE | Admit: 2024-07-06 | Discharge: 2024-07-06 | Attending: Physician Assistant | Admitting: Physician Assistant

## 2024-07-06 DIAGNOSIS — R928 Other abnormal and inconclusive findings on diagnostic imaging of breast: Secondary | ICD-10-CM | POA: Insufficient documentation

## 2024-07-06 DIAGNOSIS — N6031 Fibrosclerosis of right breast: Secondary | ICD-10-CM | POA: Insufficient documentation

## 2024-07-06 HISTORY — PX: BREAST BIOPSY: SHX20

## 2024-07-06 MED ORDER — LIDOCAINE 1 % OPTIME INJ - NO CHARGE
2.0000 mL | Freq: Once | INTRAMUSCULAR | Status: AC
  Administered 2024-07-06: 2 mL
  Filled 2024-07-06: qty 2

## 2024-07-06 MED ORDER — LIDOCAINE-EPINEPHRINE 1 %-1:100000 IJ SOLN
5.0000 mL | Freq: Once | INTRAMUSCULAR | Status: AC
  Administered 2024-07-06: 5 mL
  Filled 2024-07-06: qty 5

## 2024-07-07 LAB — SURGICAL PATHOLOGY

## 2024-07-25 ENCOUNTER — Other Ambulatory Visit: Payer: Self-pay | Admitting: Physician Assistant

## 2024-08-23 ENCOUNTER — Other Ambulatory Visit: Payer: Self-pay | Admitting: Physician Assistant

## 2025-06-07 ENCOUNTER — Ambulatory Visit
